# Patient Record
Sex: Female | Born: 1990
Health system: Southern US, Community
[De-identification: ages and names within clinical notes are randomized; demographics above are authoritative.]

## PROBLEM LIST (undated history)

## (undated) DIAGNOSIS — G43909 Migraine, unspecified, not intractable, without status migrainosus: Secondary | ICD-10-CM

## (undated) DIAGNOSIS — G8929 Other chronic pain: Secondary | ICD-10-CM

## (undated) DIAGNOSIS — M545 Low back pain, unspecified: Secondary | ICD-10-CM

---

## 2015-01-05 DIAGNOSIS — O009 Unspecified ectopic pregnancy without intrauterine pregnancy: Secondary | ICD-10-CM

## 2015-01-05 HISTORY — PX: ECTOPIC PREGNANCY SURGERY: SHX613

## 2015-01-05 HISTORY — DX: Unspecified ectopic pregnancy without intrauterine pregnancy: O00.90

## 2020-01-24 ENCOUNTER — Inpatient Hospital Stay (HOSPITAL_BASED_OUTPATIENT_CLINIC_OR_DEPARTMENT_OTHER)
Admission: EM | Admit: 2020-01-24 | Discharge: 2020-01-30 | DRG: 871 | Disposition: A | Discharge status: Home / Self Care | Payer: Medicaid Other | Attending: Internal Medicine | Admitting: Internal Medicine

## 2020-01-24 ENCOUNTER — Other Ambulatory Visit: Payer: Self-pay

## 2020-01-24 ENCOUNTER — Encounter (HOSPITAL_BASED_OUTPATIENT_CLINIC_OR_DEPARTMENT_OTHER): Payer: Self-pay | Admitting: Emergency Medicine

## 2020-01-24 DIAGNOSIS — A4189 Other specified sepsis: Principal | ICD-10-CM | POA: Diagnosis present

## 2020-01-24 DIAGNOSIS — Z881 Allergy status to other antibiotic agents status: Secondary | ICD-10-CM

## 2020-01-24 DIAGNOSIS — I309 Acute pericarditis, unspecified: Secondary | ICD-10-CM | POA: Diagnosis present

## 2020-01-24 DIAGNOSIS — M545 Low back pain, unspecified: Secondary | ICD-10-CM | POA: Diagnosis present

## 2020-01-24 DIAGNOSIS — I214 Non-ST elevation (NSTEMI) myocardial infarction: Secondary | ICD-10-CM | POA: Diagnosis present

## 2020-01-24 DIAGNOSIS — Z8249 Family history of ischemic heart disease and other diseases of the circulatory system: Secondary | ICD-10-CM

## 2020-01-24 DIAGNOSIS — K529 Noninfective gastroenteritis and colitis, unspecified: Secondary | ICD-10-CM | POA: Diagnosis present

## 2020-01-24 DIAGNOSIS — A419 Sepsis, unspecified organism: Secondary | ICD-10-CM | POA: Diagnosis present

## 2020-01-24 DIAGNOSIS — R778 Other specified abnormalities of plasma proteins: Secondary | ICD-10-CM

## 2020-01-24 DIAGNOSIS — D696 Thrombocytopenia, unspecified: Secondary | ICD-10-CM | POA: Diagnosis present

## 2020-01-24 DIAGNOSIS — E876 Hypokalemia: Secondary | ICD-10-CM | POA: Diagnosis present

## 2020-01-24 DIAGNOSIS — G8929 Other chronic pain: Secondary | ICD-10-CM | POA: Diagnosis present

## 2020-01-24 DIAGNOSIS — U071 COVID-19: Secondary | ICD-10-CM | POA: Diagnosis present

## 2020-01-24 DIAGNOSIS — G43909 Migraine, unspecified, not intractable, without status migrainosus: Secondary | ICD-10-CM | POA: Diagnosis present

## 2020-01-24 DIAGNOSIS — R571 Hypovolemic shock: Secondary | ICD-10-CM

## 2020-01-24 DIAGNOSIS — F17200 Nicotine dependence, unspecified, uncomplicated: Secondary | ICD-10-CM | POA: Diagnosis present

## 2020-01-24 DIAGNOSIS — R6521 Severe sepsis with septic shock: Secondary | ICD-10-CM | POA: Diagnosis present

## 2020-01-24 DIAGNOSIS — Z886 Allergy status to analgesic agent status: Secondary | ICD-10-CM

## 2020-01-24 DIAGNOSIS — R7989 Other specified abnormal findings of blood chemistry: Secondary | ICD-10-CM

## 2020-01-24 DIAGNOSIS — A09 Infectious gastroenteritis and colitis, unspecified: Secondary | ICD-10-CM | POA: Diagnosis present

## 2020-01-24 DIAGNOSIS — E860 Dehydration: Secondary | ICD-10-CM | POA: Diagnosis present

## 2020-01-24 DIAGNOSIS — E871 Hypo-osmolality and hyponatremia: Secondary | ICD-10-CM | POA: Diagnosis present

## 2020-01-24 HISTORY — DX: Other chronic pain: G89.29

## 2020-01-24 HISTORY — DX: Low back pain, unspecified: M54.50

## 2020-01-24 HISTORY — DX: Migraine, unspecified, not intractable, without status migrainosus: G43.909

## 2020-01-24 NOTE — ED Notes (Signed)
Pt received 900 ml NS by EMS.

## 2020-01-24 NOTE — ED Triage Notes (Signed)
Pt with Covid symptoms including fever, vomiting, diarrhea, cough, body aches since 1/15

## 2020-01-25 ENCOUNTER — Emergency Department (HOSPITAL_BASED_OUTPATIENT_CLINIC_OR_DEPARTMENT_OTHER): Payer: Medicaid Other

## 2020-01-25 ENCOUNTER — Inpatient Hospital Stay (HOSPITAL_COMMUNITY): Payer: Medicaid Other

## 2020-01-25 ENCOUNTER — Inpatient Hospital Stay (HOSPITAL_COMMUNITY)
Admission: EM | Disposition: A | Discharge status: Home / Self Care | Payer: Self-pay | Source: Home / Self Care | Attending: Internal Medicine

## 2020-01-25 ENCOUNTER — Encounter (HOSPITAL_COMMUNITY): Payer: Self-pay | Admitting: Family Medicine

## 2020-01-25 DIAGNOSIS — A419 Sepsis, unspecified organism: Secondary | ICD-10-CM | POA: Diagnosis not present

## 2020-01-25 DIAGNOSIS — Z886 Allergy status to analgesic agent status: Secondary | ICD-10-CM | POA: Diagnosis not present

## 2020-01-25 DIAGNOSIS — U071 COVID-19: Secondary | ICD-10-CM | POA: Diagnosis present

## 2020-01-25 DIAGNOSIS — I309 Acute pericarditis, unspecified: Secondary | ICD-10-CM | POA: Diagnosis present

## 2020-01-25 DIAGNOSIS — K529 Noninfective gastroenteritis and colitis, unspecified: Secondary | ICD-10-CM

## 2020-01-25 DIAGNOSIS — I361 Nonrheumatic tricuspid (valve) insufficiency: Secondary | ICD-10-CM | POA: Diagnosis not present

## 2020-01-25 DIAGNOSIS — G43909 Migraine, unspecified, not intractable, without status migrainosus: Secondary | ICD-10-CM | POA: Diagnosis present

## 2020-01-25 DIAGNOSIS — A4189 Other specified sepsis: Secondary | ICD-10-CM | POA: Diagnosis present

## 2020-01-25 DIAGNOSIS — I214 Non-ST elevation (NSTEMI) myocardial infarction: Secondary | ICD-10-CM | POA: Diagnosis present

## 2020-01-25 DIAGNOSIS — Z8249 Family history of ischemic heart disease and other diseases of the circulatory system: Secondary | ICD-10-CM | POA: Diagnosis not present

## 2020-01-25 DIAGNOSIS — R6521 Severe sepsis with septic shock: Secondary | ICD-10-CM | POA: Diagnosis present

## 2020-01-25 DIAGNOSIS — M545 Low back pain, unspecified: Secondary | ICD-10-CM | POA: Diagnosis present

## 2020-01-25 DIAGNOSIS — R079 Chest pain, unspecified: Secondary | ICD-10-CM

## 2020-01-25 DIAGNOSIS — R509 Fever, unspecified: Secondary | ICD-10-CM | POA: Diagnosis present

## 2020-01-25 DIAGNOSIS — R778 Other specified abnormalities of plasma proteins: Secondary | ICD-10-CM

## 2020-01-25 DIAGNOSIS — D696 Thrombocytopenia, unspecified: Secondary | ICD-10-CM | POA: Diagnosis present

## 2020-01-25 DIAGNOSIS — I340 Nonrheumatic mitral (valve) insufficiency: Secondary | ICD-10-CM | POA: Diagnosis not present

## 2020-01-25 DIAGNOSIS — E876 Hypokalemia: Secondary | ICD-10-CM

## 2020-01-25 DIAGNOSIS — E860 Dehydration: Secondary | ICD-10-CM | POA: Diagnosis present

## 2020-01-25 DIAGNOSIS — A09 Infectious gastroenteritis and colitis, unspecified: Secondary | ICD-10-CM | POA: Diagnosis present

## 2020-01-25 DIAGNOSIS — F17200 Nicotine dependence, unspecified, uncomplicated: Secondary | ICD-10-CM | POA: Diagnosis present

## 2020-01-25 DIAGNOSIS — I400 Infective myocarditis: Secondary | ICD-10-CM | POA: Diagnosis not present

## 2020-01-25 DIAGNOSIS — B3321 Viral endocarditis: Secondary | ICD-10-CM | POA: Diagnosis not present

## 2020-01-25 DIAGNOSIS — Z881 Allergy status to other antibiotic agents status: Secondary | ICD-10-CM | POA: Diagnosis not present

## 2020-01-25 DIAGNOSIS — G8929 Other chronic pain: Secondary | ICD-10-CM | POA: Diagnosis present

## 2020-01-25 DIAGNOSIS — E871 Hypo-osmolality and hyponatremia: Secondary | ICD-10-CM | POA: Diagnosis present

## 2020-01-25 HISTORY — PX: RIGHT/LEFT HEART CATH AND CORONARY ANGIOGRAPHY: CATH118266

## 2020-01-25 LAB — C-REACTIVE PROTEIN: CRP: 29.4 mg/dL — ABNORMAL HIGH (ref <1.0)

## 2020-01-25 LAB — D-DIMER, QUANTITATIVE: D-Dimer, Quant: 2.45 ug/mL-FEU — ABNORMAL HIGH (ref 0.00–0.50)

## 2020-01-25 LAB — HIV ANTIBODY (ROUTINE TESTING W REFLEX): HIV Screen 4th Generation wRfx: NONREACTIVE

## 2020-01-25 LAB — COMPREHENSIVE METABOLIC PANEL
ALT: 34 U/L (ref 0–44)
AST: 42 U/L — ABNORMAL HIGH (ref 15–41)
Albumin: 3 g/dL — ABNORMAL LOW (ref 3.5–5.0)
Alkaline Phosphatase: 89 U/L (ref 38–126)
Anion gap: 13 (ref 5–15)
BUN: 21 mg/dL — ABNORMAL HIGH (ref 6–20)
CO2: 25 mmol/L (ref 22–32)
Calcium: 7.9 mg/dL — ABNORMAL LOW (ref 8.9–10.3)
Chloride: 94 mmol/L — ABNORMAL LOW (ref 98–111)
Creatinine, Ser: 0.9 mg/dL (ref 0.44–1.00)
GFR, Estimated: >60 mL/min (ref >60)
Glucose, Bld: 96 mg/dL (ref 70–99)
Potassium: 3.4 mmol/L — ABNORMAL LOW (ref 3.5–5.1)
Sodium: 132 mmol/L — ABNORMAL LOW (ref 135–145)
Total Bilirubin: 1.2 mg/dL (ref 0.3–1.2)
Total Protein: 6.9 g/dL (ref 6.5–8.1)

## 2020-01-25 LAB — ECHOCARDIOGRAM LIMITED
Area-P 1/2: 5.42 cm2
Calc EF: 47.5 %
Height: 67 in
S' Lateral: 3.1 cm
Single Plane A2C EF: 48.9 %
Single Plane A4C EF: 47.4 %
Weight: 2304 oz

## 2020-01-25 LAB — CBC WITH DIFFERENTIAL/PLATELET
Abs Immature Granulocytes: 0.14 10*3/uL — ABNORMAL HIGH (ref 0.00–0.07)
Abs Immature Granulocytes: 0.09 10*3/uL — ABNORMAL HIGH (ref 0.00–0.07)
Basophils Absolute: 0.1 10*3/uL (ref 0.0–0.1)
Basophils Absolute: 0 10*3/uL (ref 0.0–0.1)
Basophils Relative: 1 %
Basophils Relative: 0 %
Eosinophils Absolute: 0.1 10*3/uL (ref 0.0–0.5)
Eosinophils Absolute: 0.1 10*3/uL (ref 0.0–0.5)
Eosinophils Relative: 0 %
Eosinophils Relative: 1 %
HCT: 40.5 % (ref 36.0–46.0)
HCT: 35.5 % — ABNORMAL LOW (ref 36.0–46.0)
Hemoglobin: 14 g/dL (ref 12.0–15.0)
Hemoglobin: 11.9 g/dL — ABNORMAL LOW (ref 12.0–15.0)
Immature Granulocytes: 1 %
Immature Granulocytes: 1 %
Lymphocytes Relative: 3 %
Lymphocytes Relative: 4 %
Lymphs Abs: 0.3 10*3/uL — ABNORMAL LOW (ref 0.7–4.0)
Lymphs Abs: 0.4 10*3/uL — ABNORMAL LOW (ref 0.7–4.0)
MCH: 30 pg (ref 26.0–34.0)
MCH: 29.5 pg (ref 26.0–34.0)
MCHC: 34.6 g/dL (ref 30.0–36.0)
MCHC: 33.5 g/dL (ref 30.0–36.0)
MCV: 86.7 fL (ref 80.0–100.0)
MCV: 87.9 fL (ref 80.0–100.0)
Monocytes Absolute: 0.3 10*3/uL (ref 0.1–1.0)
Monocytes Absolute: 0.3 10*3/uL (ref 0.1–1.0)
Monocytes Relative: 2 %
Monocytes Relative: 3 %
Neutro Abs: 12.4 10*3/uL — ABNORMAL HIGH (ref 1.7–7.7)
Neutro Abs: 9.7 10*3/uL — ABNORMAL HIGH (ref 1.7–7.7)
Neutrophils Relative %: 93 %
Neutrophils Relative %: 91 %
Platelets: 92 10*3/uL — ABNORMAL LOW (ref 150–400)
Platelets: 84 10*3/uL — ABNORMAL LOW (ref 150–400)
RBC: 4.67 MIL/uL (ref 3.87–5.11)
RBC: 4.04 MIL/uL (ref 3.87–5.11)
RDW: 14.2 % (ref 11.5–15.5)
RDW: 14.2 % (ref 11.5–15.5)
WBC: 13.2 10*3/uL — ABNORMAL HIGH (ref 4.0–10.5)
WBC: 10.6 10*3/uL — ABNORMAL HIGH (ref 4.0–10.5)
nRBC: 0 % (ref 0.0–0.2)
nRBC: 0 % (ref 0.0–0.2)

## 2020-01-25 LAB — TROPONIN I (HIGH SENSITIVITY)
Troponin I (High Sensitivity): 3072 ng/L (ref <18)
Troponin I (High Sensitivity): 3050 ng/L (ref <18)

## 2020-01-25 LAB — POCT I-STAT EG7
Acid-Base Excess: 2 mmol/L (ref 0.0–2.0)
Bicarbonate: 24.9 mmol/L (ref 20.0–28.0)
Calcium, Ion: 1.06 mmol/L — ABNORMAL LOW (ref 1.15–1.40)
HCT: 32 % — ABNORMAL LOW (ref 36.0–46.0)
Hemoglobin: 10.9 g/dL — ABNORMAL LOW (ref 12.0–15.0)
O2 Saturation: 60 %
Potassium: 3.2 mmol/L — ABNORMAL LOW (ref 3.5–5.1)
Sodium: 135 mmol/L (ref 135–145)
TCO2: 26 mmol/L (ref 22–32)
pCO2, Ven: 33.8 mmHg — ABNORMAL LOW (ref 44.0–60.0)
pH, Ven: 7.476 — ABNORMAL HIGH (ref 7.250–7.430)
pO2, Ven: 29 mmHg — CL (ref 32.0–45.0)

## 2020-01-25 LAB — BASIC METABOLIC PANEL
Anion gap: 11 (ref 5–15)
BUN: 13 mg/dL (ref 6–20)
CO2: 24 mmol/L (ref 22–32)
Calcium: 7.8 mg/dL — ABNORMAL LOW (ref 8.9–10.3)
Chloride: 98 mmol/L (ref 98–111)
Creatinine, Ser: 0.77 mg/dL (ref 0.44–1.00)
GFR, Estimated: >60 mL/min (ref >60)
Glucose, Bld: 100 mg/dL — ABNORMAL HIGH (ref 70–99)
Potassium: 3.2 mmol/L — ABNORMAL LOW (ref 3.5–5.1)
Sodium: 133 mmol/L — ABNORMAL LOW (ref 135–145)

## 2020-01-25 LAB — LACTATE DEHYDROGENASE: LDH: 143 U/L (ref 98–192)

## 2020-01-25 LAB — PROCALCITONIN: Procalcitonin: 4.96 ng/mL

## 2020-01-25 LAB — FERRITIN: Ferritin: 136 ng/mL (ref 11–307)

## 2020-01-25 LAB — PREGNANCY, URINE: Preg Test, Ur: NEGATIVE

## 2020-01-25 LAB — LACTIC ACID, PLASMA
Lactic Acid, Venous: 1.7 mmol/L (ref 0.5–1.9)
Lactic Acid, Venous: 1.8 mmol/L (ref 0.5–1.9)

## 2020-01-25 LAB — SARS CORONAVIRUS 2 BY RT PCR (HOSPITAL ORDER, PERFORMED IN ~~LOC~~ HOSPITAL LAB): SARS Coronavirus 2: POSITIVE — AB

## 2020-01-25 LAB — LIPASE, BLOOD: Lipase: 21 U/L (ref 11–51)

## 2020-01-25 LAB — MAGNESIUM: Magnesium: 2 mg/dL (ref 1.7–2.4)

## 2020-01-25 LAB — TRIGLYCERIDES: Triglycerides: 263 mg/dL — ABNORMAL HIGH (ref <150)

## 2020-01-25 LAB — FIBRINOGEN: Fibrinogen: 558 mg/dL — ABNORMAL HIGH (ref 210–475)

## 2020-01-25 SURGERY — RIGHT/LEFT HEART CATH AND CORONARY ANGIOGRAPHY
Anesthesia: LOCAL

## 2020-01-25 MED ORDER — VERAPAMIL HCL 2.5 MG/ML IV SOLN
INTRAVENOUS | Status: AC
  Filled 2020-01-25: qty 2

## 2020-01-25 MED ORDER — IOHEXOL 350 MG/ML SOLN
INTRAVENOUS | Status: DC | PRN
  Administered 2020-01-25: 45 mL

## 2020-01-25 MED ORDER — ZINC SULFATE 220 (50 ZN) MG PO CAPS
220.0000 mg | ORAL_CAPSULE | Freq: Every day | ORAL | Status: DC
  Administered 2020-01-26 – 2020-01-30 (×5): 220 mg via ORAL
  Filled 2020-01-25 (×6): qty 1

## 2020-01-25 MED ORDER — SODIUM CHLORIDE 0.9 % IV SOLN: INTRAVENOUS | Status: AC

## 2020-01-25 MED ORDER — LIDOCAINE HCL (PF) 1 % IJ SOLN
INTRAMUSCULAR | Status: AC
  Filled 2020-01-25: qty 30

## 2020-01-25 MED ORDER — HEPARIN (PORCINE) 25000 UT/250ML-% IV SOLN: 800.0000 [IU]/h | INTRAVENOUS | Status: DC

## 2020-01-25 MED ORDER — IOHEXOL 350 MG/ML SOLN
INTRAVENOUS | Status: AC
  Filled 2020-01-25: qty 1

## 2020-01-25 MED ORDER — HEPARIN BOLUS VIA INFUSION
4000.0000 [IU] | Freq: Once | INTRAVENOUS | Status: AC
  Administered 2020-01-25: 4000 [IU] via INTRAVENOUS
  Filled 2020-01-25: qty 4000

## 2020-01-25 MED ORDER — GUAIFENESIN-DM 100-10 MG/5ML PO SYRP: 10.0000 mL | ORAL_SOLUTION | ORAL | Status: DC | PRN

## 2020-01-25 MED ORDER — LOPERAMIDE HCL 2 MG PO CAPS: 2.0000 mg | ORAL_CAPSULE | ORAL | Status: DC | PRN

## 2020-01-25 MED ORDER — SODIUM CHLORIDE 0.9 % IV SOLN: 250.0000 mL | INTRAVENOUS | Status: DC | PRN

## 2020-01-25 MED ORDER — METRONIDAZOLE IN NACL 5-0.79 MG/ML-% IV SOLN
500.0000 mg | Freq: Three times a day (TID) | INTRAVENOUS | Status: DC
  Administered 2020-01-25 – 2020-01-29 (×11): 500 mg via INTRAVENOUS
  Filled 2020-01-25 (×12): qty 100

## 2020-01-25 MED ORDER — SODIUM CHLORIDE 0.9 % IV SOLN
100.0000 mg | Freq: Every day | INTRAVENOUS | Status: AC
  Administered 2020-01-26 – 2020-01-29 (×4): 100 mg via INTRAVENOUS
  Filled 2020-01-25 (×4): qty 20

## 2020-01-25 MED ORDER — CALCIUM GLUCONATE-NACL 2-0.675 GM/100ML-% IV SOLN
2.0000 g | Freq: Once | INTRAVENOUS | Status: AC
  Administered 2020-01-25: 2000 mg via INTRAVENOUS
  Filled 2020-01-25: qty 100

## 2020-01-25 MED ORDER — HEPARIN (PORCINE) IN NACL 1000-0.9 UT/500ML-% IV SOLN
INTRAVENOUS | Status: DC | PRN
  Administered 2020-01-25: 500 mL

## 2020-01-25 MED ORDER — ONDANSETRON HCL 4 MG/2ML IJ SOLN
4.0000 mg | Freq: Four times a day (QID) | INTRAMUSCULAR | Status: DC | PRN
  Administered 2020-01-25 – 2020-01-26 (×2): 4 mg via INTRAVENOUS
  Filled 2020-01-25 (×2): qty 2

## 2020-01-25 MED ORDER — ACETAMINOPHEN 325 MG PO TABS
650.0000 mg | ORAL_TABLET | Freq: Four times a day (QID) | ORAL | Status: DC | PRN
  Administered 2020-01-25 – 2020-01-29 (×3): 650 mg via ORAL
  Filled 2020-01-25 (×3): qty 2

## 2020-01-25 MED ORDER — POTASSIUM CHLORIDE CRYS ER 20 MEQ PO TBCR
40.0000 meq | EXTENDED_RELEASE_TABLET | ORAL | Status: AC
  Administered 2020-01-25: 40 meq via ORAL
  Filled 2020-01-25: qty 2

## 2020-01-25 MED ORDER — MIDAZOLAM HCL 2 MG/2ML IJ SOLN
INTRAMUSCULAR | Status: AC
  Filled 2020-01-25: qty 2

## 2020-01-25 MED ORDER — LACTATED RINGERS IV SOLN: INTRAVENOUS | Status: DC

## 2020-01-25 MED ORDER — ACETAMINOPHEN 500 MG PO TABS
1000.0000 mg | ORAL_TABLET | Freq: Once | ORAL | Status: AC
  Administered 2020-01-25: 1000 mg via ORAL
  Filled 2020-01-25: qty 2

## 2020-01-25 MED ORDER — SODIUM CHLORIDE 0.9% FLUSH: 3.0000 mL | INTRAVENOUS | Status: DC | PRN

## 2020-01-25 MED ORDER — FAMOTIDINE 20 MG PO TABS
20.0000 mg | ORAL_TABLET | Freq: Two times a day (BID) | ORAL | Status: DC
  Administered 2020-01-25 – 2020-01-30 (×10): 20 mg via ORAL
  Filled 2020-01-25 (×10): qty 1

## 2020-01-25 MED ORDER — LABETALOL HCL 5 MG/ML IV SOLN: 10.0000 mg | INTRAVENOUS | Status: AC | PRN

## 2020-01-25 MED ORDER — HEPARIN BOLUS VIA INFUSION: 4000.0000 [IU] | Freq: Once | INTRAVENOUS | Status: DC

## 2020-01-25 MED ORDER — HEPARIN (PORCINE) 25000 UT/250ML-% IV SOLN
800.0000 [IU]/h | INTRAVENOUS | Status: DC
  Administered 2020-01-25: 800 [IU]/h via INTRAVENOUS
  Filled 2020-01-25: qty 250

## 2020-01-25 MED ORDER — SODIUM CHLORIDE 0.9% FLUSH
3.0000 mL | Freq: Two times a day (BID) | INTRAVENOUS | Status: DC
  Administered 2020-01-25: 3 mL via INTRAVENOUS

## 2020-01-25 MED ORDER — FENTANYL CITRATE (PF) 100 MCG/2ML IJ SOLN
INTRAMUSCULAR | Status: AC
  Filled 2020-01-25: qty 2

## 2020-01-25 MED ORDER — HEPARIN (PORCINE) IN NACL 1000-0.9 UT/500ML-% IV SOLN
INTRAVENOUS | Status: AC
  Filled 2020-01-25: qty 1000

## 2020-01-25 MED ORDER — CLOPIDOGREL BISULFATE 300 MG PO TABS
ORAL_TABLET | ORAL | Status: AC
  Filled 2020-01-25: qty 1

## 2020-01-25 MED ORDER — LACTATED RINGERS IV BOLUS: 1000.0000 mL | Freq: Once | INTRAVENOUS | Status: DC

## 2020-01-25 MED ORDER — SODIUM CHLORIDE 0.9 % IV BOLUS: 1000.0000 mL | Freq: Once | INTRAVENOUS | Status: DC

## 2020-01-25 MED ORDER — TICAGRELOR 90 MG PO TABS
ORAL_TABLET | ORAL | Status: AC
  Filled 2020-01-25: qty 1

## 2020-01-25 MED ORDER — SODIUM CHLORIDE 0.9% FLUSH
3.0000 mL | Freq: Two times a day (BID) | INTRAVENOUS | Status: DC
  Administered 2020-01-25 – 2020-01-29 (×9): 3 mL via INTRAVENOUS

## 2020-01-25 MED ORDER — LACTATED RINGERS IV BOLUS
1000.0000 mL | Freq: Once | INTRAVENOUS | Status: AC
  Administered 2020-01-25: 1000 mL via INTRAVENOUS

## 2020-01-25 MED ORDER — SODIUM CHLORIDE 0.9 % IV SOLN
2.0000 g | INTRAVENOUS | Status: DC
  Administered 2020-01-25 – 2020-01-29 (×5): 2 g via INTRAVENOUS
  Filled 2020-01-25: qty 2
  Filled 2020-01-25: qty 20
  Filled 2020-01-25 (×3): qty 2
  Filled 2020-01-25: qty 20

## 2020-01-25 MED ORDER — ALBUTEROL SULFATE HFA 108 (90 BASE) MCG/ACT IN AERS
2.0000 | INHALATION_SPRAY | Freq: Four times a day (QID) | RESPIRATORY_TRACT | Status: DC
  Administered 2020-01-26 (×2): 2 via RESPIRATORY_TRACT
  Filled 2020-01-25 (×2): qty 6.7

## 2020-01-25 MED ORDER — IOHEXOL 300 MG/ML  SOLN
100.0000 mL | Freq: Once | INTRAMUSCULAR | Status: AC | PRN
  Administered 2020-01-25: 80 mL via INTRAVENOUS

## 2020-01-25 MED ORDER — HYDROCOD POLST-CPM POLST ER 10-8 MG/5ML PO SUER: 5.0000 mL | Freq: Two times a day (BID) | ORAL | Status: DC | PRN

## 2020-01-25 MED ORDER — COLCHICINE 0.6 MG PO TABS
0.6000 mg | ORAL_TABLET | Freq: Two times a day (BID) | ORAL | Status: DC
  Administered 2020-01-25: 0.6 mg via ORAL
  Filled 2020-01-25: qty 1

## 2020-01-25 MED ORDER — IOHEXOL 350 MG/ML SOLN
100.0000 mL | Freq: Once | INTRAVENOUS | Status: AC | PRN
  Administered 2020-01-25: 100 mL via INTRAVENOUS

## 2020-01-25 MED ORDER — IBUPROFEN 600 MG PO TABS
600.0000 mg | ORAL_TABLET | Freq: Three times a day (TID) | ORAL | Status: DC
  Administered 2020-01-26 – 2020-01-30 (×14): 600 mg via ORAL
  Filled 2020-01-25: qty 3
  Filled 2020-01-25: qty 1
  Filled 2020-01-25: qty 3
  Filled 2020-01-25 (×7): qty 1
  Filled 2020-01-25 (×4): qty 3
  Filled 2020-01-25 (×4): qty 1
  Filled 2020-01-25 (×3): qty 3
  Filled 2020-01-25 (×5): qty 1

## 2020-01-25 MED ORDER — ONDANSETRON HCL 4 MG/2ML IJ SOLN
4.0000 mg | Freq: Once | INTRAMUSCULAR | Status: AC
  Administered 2020-01-25: 4 mg via INTRAVENOUS
  Filled 2020-01-25: qty 2

## 2020-01-25 MED ORDER — CLOPIDOGREL BISULFATE 75 MG PO TABS
ORAL_TABLET | ORAL | Status: AC
  Filled 2020-01-25: qty 1

## 2020-01-25 MED ORDER — NITROGLYCERIN 1 MG/10 ML FOR IR/CATH LAB
INTRA_ARTERIAL | Status: AC
  Filled 2020-01-25: qty 10

## 2020-01-25 MED ORDER — METHYLPREDNISOLONE SODIUM SUCC 40 MG IJ SOLR
0.5000 mg/kg | Freq: Two times a day (BID) | INTRAMUSCULAR | Status: DC
  Administered 2020-01-26 – 2020-01-29 (×8): 32.8 mg via INTRAVENOUS
  Filled 2020-01-25 (×8): qty 1

## 2020-01-25 MED ORDER — SODIUM CHLORIDE 0.9 % IV SOLN
INTRAVENOUS | Status: AC | PRN
  Administered 2020-01-25: 100 mL/h via INTRAVENOUS

## 2020-01-25 MED ORDER — HYDRALAZINE HCL 20 MG/ML IJ SOLN: 10.0000 mg | INTRAMUSCULAR | Status: AC | PRN

## 2020-01-25 MED ORDER — ONDANSETRON HCL 4 MG PO TABS
4.0000 mg | ORAL_TABLET | Freq: Four times a day (QID) | ORAL | Status: DC | PRN
  Filled 2020-01-25: qty 1

## 2020-01-25 MED ORDER — ASCORBIC ACID 500 MG PO TABS
500.0000 mg | ORAL_TABLET | Freq: Every day | ORAL | Status: DC
  Administered 2020-01-26 – 2020-01-30 (×5): 500 mg via ORAL
  Filled 2020-01-25 (×6): qty 1

## 2020-01-25 MED ORDER — POTASSIUM CHLORIDE CRYS ER 20 MEQ PO TBCR
20.0000 meq | EXTENDED_RELEASE_TABLET | ORAL | Status: DC
  Filled 2020-01-25: qty 1

## 2020-01-25 MED ORDER — SODIUM CHLORIDE 0.9% FLUSH: 3.0000 mL | Freq: Two times a day (BID) | INTRAVENOUS | Status: DC

## 2020-01-25 MED ORDER — PREDNISONE 50 MG PO TABS: 50.0000 mg | ORAL_TABLET | Freq: Every day | ORAL | Status: DC

## 2020-01-25 MED ORDER — SODIUM CHLORIDE 0.9 % IV BOLUS
1000.0000 mL | INTRAVENOUS | Status: AC
  Administered 2020-01-25: 1000 mL via INTRAVENOUS

## 2020-01-25 MED ORDER — LIDOCAINE HCL (PF) 1 % IJ SOLN
INTRAMUSCULAR | Status: DC | PRN
  Administered 2020-01-25: 1 mL
  Administered 2020-01-25: 20 mL

## 2020-01-25 MED ORDER — OXYCODONE-ACETAMINOPHEN 5-325 MG PO TABS
1.0000 | ORAL_TABLET | Freq: Four times a day (QID) | ORAL | Status: DC | PRN
  Administered 2020-01-25: 1 via ORAL
  Filled 2020-01-25: qty 1

## 2020-01-25 MED ORDER — SODIUM CHLORIDE 0.9 % IV SOLN
200.0000 mg | Freq: Once | INTRAVENOUS | Status: AC
  Administered 2020-01-25: 200 mg via INTRAVENOUS
  Filled 2020-01-25: qty 40

## 2020-01-25 SURGICAL SUPPLY — 19 items
BAG SNAP BAND KOVER 36X36 (MISCELLANEOUS) ×2 IMPLANT
CATH 5FR JL3.5 JR4 ANG PIG MP (CATHETERS) ×2 IMPLANT
CATH BALLN WEDGE 5F 110CM (CATHETERS) IMPLANT
CATH INFINITI 5FR JL4 (CATHETERS) ×2 IMPLANT
CATH SWAN GANZ 7F STRAIGHT (CATHETERS) ×2 IMPLANT
CLOSURE MYNX CONTROL 5F (Vascular Products) ×2 IMPLANT
COVER DOME SNAP 22 D (MISCELLANEOUS) ×2 IMPLANT
GLIDESHEATH SLEND SS 6F .021 (SHEATH) IMPLANT
GUIDEWIRE INQWIRE 1.5J.035X260 (WIRE) IMPLANT
INQWIRE 1.5J .035X260CM (WIRE)
KIT HEART LEFT (KITS) ×2 IMPLANT
PACK CARDIAC CATHETERIZATION (CUSTOM PROCEDURE TRAY) ×2 IMPLANT
SHEATH GLIDE SLENDER 4/5FR (SHEATH) IMPLANT
SHEATH PINNACLE 5F 10CM (SHEATH) ×2 IMPLANT
SHEATH PINNACLE 7F 10CM (SHEATH) ×2 IMPLANT
SHEATH PROBE COVER 6X72 (BAG) ×2 IMPLANT
TRANSDUCER W/STOPCOCK (MISCELLANEOUS) ×2 IMPLANT
TUBING CIL FLEX 10 FLL-RA (TUBING) ×2 IMPLANT
WIRE EMERALD 3MM-J .035X150CM (WIRE) ×2 IMPLANT

## 2020-01-25 NOTE — Plan of Care (Signed)

## 2020-01-25 NOTE — Progress Notes (Signed)
°  Echocardiogram 2D Echocardiogram has been performed.  Augustine Radar 01/25/2020, 11:47 AM

## 2020-01-25 NOTE — Interval H&P Note (Signed)
History and Physical Interval Note:  01/25/2020 3:05 PM  Katrina [name]  has presented today for surgery, with the diagnosis of chest pain.  The various methods of treatment have been discussed with the patient and family. After consideration of risks, benefits and other options for treatment, the patient has consented to  Procedure(s): RIGHT/LEFT HEART CATH AND CORONARY ANGIOGRAPHY (N/A) as a surgical intervention.  The patient's history has been reviewed, patient examined, no change in status, stable for surgery.  I have reviewed the patient's chart and labs.  Questions were answered to the patient's satisfaction.    Cath Lab Visit (complete for each Cath Lab visit)  Clinical Evaluation Leading to the Procedure:   ACS: Yes.    Non-ACS:    Anginal Classification: CCS II  Anti-ischemic medical therapy: No Therapy  Non-Invasive Test Results: No non-invasive testing performed  Prior CABG: No previous CABG        Verne Carrow

## 2020-01-25 NOTE — Progress Notes (Addendum)
CT angio results:  IMPRESSION: 1. No evidence of pulmonary embolism. 2. Mild atelectasis versus subtle infiltrate at the posterior right lung base. Otherwise no significant airspace disease or evidence of significant COVID pneumonia. 3. Trace pleural fluid in the inferior right hemithorax. 4. Suggestion of possibly mild dilatation of the left ventricle by CT. 5. Suggestion of edema in the body wall fat suggestive of anasarca. 6. Small nonspecific 3 mm nodule in the right middle lobe is likely benign/postinflammatory.  Per d/w Dr. Izora Ribas, proceed with [facility] - he consented patient for this procedure and spoke with Dr. Clifton James who will do the case. Orders written. Already receiving IV fluids so did not adjust those orders. She is noted to have an aspirin allergy which she reports was diffuse rash in 2008 all over her body. Per discussion with MD, hold off on any additional alternative antiplatelet loading at this time pending cath results. Spoke with nurse and patient over the phone and answered patient's questions. Regarding lytes, KCl was ordered a short while ago but not yet able to be given - will request instead of in prep for cath. Also sent update to IM.  Adellyn Capek PA-C

## 2020-01-25 NOTE — Consult Note (Addendum)
Cardiology Consultation:   Patient ID: Katrina [name] MRN: [id number]; DOB: [birth year]  Admit date: 01/24/2020 Date of Consult: 01/25/2020  Primary Care Provider: Patient, No Pcp Per [facility] Cardiologist: New to cardiology [facility] Electrophysiologist:  None   Patient Profile:   Katrina [name] is a 30 y.o. female with a hx of migraines, back pain, possible prior heart murmur who is being seen today for the evaluation of elevated troponin (3k) at the request of Dr. Jacqulyn Bath.  She also presents with Covid symptoms and tested positive. vaccination status: unvaccinated  History of Present Illness:   Katrina [name] has no formal cardiac history aside from being told she had a heart murmur sometime ago perhaps as a teenager. She does not recall this ever being a major issue. She does have significant family history of heart disease - her father died of an MI in his 66s (drugs involved), her brother developed heart failure around age 42, and mother has some sort of tachycardia.   She was in her USOH up until 01/19/19 when she developed fever of 103.5 followed by progressive Covid symptoms to include body aches, coughing, nausea, vomiting, diarrhea especially worse over the last 3 days. She had EMS come out to her house on Tuesday and they administered IVF. She originally felt better for about 12 hours bit symptoms only returned and worsened. She presented to [facility] due to persistent symptoms, including development of persistent chest discomfort and SOB keeping her from being able to sleep. The chest discomfort is substernal and persistent, worse with coughing and breathing, but there to some degree all the time. Upon arrival, she was febrile at 100.5, tachycardic at 116bpm (sinus), and hypotensive with nadir BP 79/51. Labs showed hyponatremia, hypokalemia, mildly elevated BUN of 21 with normal creatinine, calcium of 7.9, albumin of 3.0, AST of 42, leukocytosis 13.2 with elevated  neutrophils, thrombocytopenia with platelet count of 92, negative urine pregnancy test, d-dimer 2.45, CRP 29, troponin 3072->3050. CT of the abdomen/pelvis showed findings concerning for infectious or inflammatory enterocolitis, small free fluid in the abdomen pelvis, small right sided pleural effusions, trace pericardial effusion. CXR NAD. EKG showed sinus tachycardia with inferior ST elevation as well as STE in  V5-V6 but without acute reciprocal changes so not felt to meet STEMI criteria. ED discussed with cardiology fellow on call and per their discussion decision was made to hold off heparin - initial suspicion for myocarditis. She was treated with IV fluids with some improvement in her nausea. She was subsequently transferred to [facility] as soon as a bed was available and continues to be tachycardic and hypotensive despite ongoing fluid resuscitation. She also continues to complain of the same constant chest discomfort she had when she first came into the hospital. Pulse ox seems to be maintaining on RA for the most part but occasional outliers and high RR. Repeat EKG here showed similar STT changes but reviewed with interventionalist who agreed not necessarily STEMI criteria.   Past Medical History:  Diagnosis Date   Chronic back pain    Chronic lower back pain    Ectopic pregnancy 2017   Migraine     Past Surgical History:  Procedure Laterality Date   ECTOPIC PREGNANCY SURGERY  2017     Home Medications:  Prior to Admission medications   Not on File    Inpatient Medications: Scheduled Meds:  albuterol  2 puff Inhalation Q6H   vitamin C  500 mg Oral Daily   famotidine  20 mg Oral BID   heparin  4,000 Units Intravenous Once   methylPREDNISolone (SOLU-MEDROL) injection  0.5 mg/kg Intravenous Q12H   Followed by   Melene Muller ON 01/28/2020] predniSONE  50 mg Oral Daily   potassium chloride  20 mEq Oral STAT   sodium chloride flush  3 mL Intravenous Q12H   zinc  sulfate  220 mg Oral Daily   Continuous Infusions:  calcium gluconate     cefTRIAXone (ROCEPHIN)  IV     heparin     lactated ringers Stopped (01/25/20 0510)   lactated ringers 200 mL/hr at 01/25/20 0735   metronidazole     remdesivir 200 mg in sodium chloride 0.9% 250 mL IVPB     Followed by   Melene Muller ON 01/26/2020] remdesivir 100 mg in NS 100 mL     PRN Meds: acetaminophen, chlorpheniramine-HYDROcodone, guaiFENesin-dextromethorphan, ondansetron **OR** ondansetron (ZOFRAN) IV  Allergies:    Allergies  Allergen Reactions   Aspirin Hives and Rash   Sulfamethoxazole-Trimethoprim Nausea And Vomiting    Social History:   Social History   Socioeconomic History   Marital status: Single    Spouse name: Not on file   Number of children: Not on file   Years of education: Not on file   Highest education level: Not on file  Occupational History   Not on file  Tobacco Use   Smoking status: Light Tobacco Smoker   Smokeless tobacco: Never Used   Tobacco comment: Very rare smoking  Substance and Sexual Activity   Alcohol use: Not Currently   Drug use: Never   Sexual activity: Not on file  Other Topics Concern   Not on file  Social History Narrative   Not on file   Social Determinants of Health   Financial Resource Strain: Not on file  Food Insecurity: Not on file  Transportation Needs: Not on file  Physical Activity: Not on file  Stress: Not on file  Social Connections: Not on file  Intimate Partner Violence: Not on file    Family History:    Family History  Problem Relation Age of Onset   Arrhythmia Mother        Tachycardia, unclear further details   Heart attack Father        Died of MI in his 49s   Heart failure Brother 68   Heart disease Maternal Grandfather      ROS:  Please see the history of present illness.  All other ROS reviewed and negative.     Physical Exam/Data:   Vitals:   01/25/20 0830 01/25/20 1019 01/25/20 1212  01/25/20 1235  BP: (!) 89/65 (!) 85/56 (!) 84/64 (!) 87/67  Pulse: (!) 109 (!) 109 (!) 111 (!) 109  Resp: 20 20 (!) 35 (!) 34  Temp: 99.2 F (37.3 C) 99.1 F (37.3 C) 99.2 F (37.3 C)   TempSrc: Oral Oral    SpO2: 96% 97% (!) 84% 98%  Weight:      Height:        Intake/Output Summary (Last 24 hours) at 01/25/2020 1251 Last data filed at 01/25/2020 0734 Gross per 24 hour  Intake 4001.19 ml  Output --  Net 4001.19 ml   Last 3 Weights 01/24/2020  Weight (lbs) 144 lb  Weight (kg) 65.318 kg     Body mass index is 22.55 kg/m.  Physical Exam From Prior: Gen:  In mild disterss, well developed femeale Neck:  No JVD Cards:  Presence of pulsus alternans, S1, S2, subtle  RV heave, without murmur Lungs:  Coarse breath sounds bilaterally, good air movement Extremities: +2 radial bilaterally Neuro:  Non focal Psych: appropriate mood and affect Addendum:  Physical Exam by MD replacing APPs   EKG:  The EKG was personally reviewed and demonstrates:   1) sinus tach 106bpm, ST elevation II, III, avF, more subtle in V4-V6, QTc 433ms 2) sinus tach 108bpm, still with subtle STE inferior and V5-V6 (less pronounced than prior), coved ST appearance in V2-V3  Telemetry:  Telemetry was personally reviewed and demonstrates:  Sinus tach (low level)  Relevant CV Studies: Limited echo 01/25/20 1. Posterolateral hypokinesis. Hypokinesis of the basal to mid regions  with apical sparing. 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  Left ventricular ejection fraction, by estimation, is 40 to 45%. The left  ventricle has mildly decreased  function. The left ventricle demonstrates regional wall motion  abnormalities (see scoring diagram/findings for description). Left  ventricular diastolic parameters are consistent with Grade II diastolic  dysfunction (pseudonormalization).  2. Hypokinesis of the apical RV. Right ventricular systolic function is  mildly reduced. There is normal pulmonary  artery systolic pressure.  3. A small pericardial effusion is present.  4. Mild mitral valve regurgitation.  5. The aortic valve is tricuspid.  6. The inferior vena cava is normal in size with <50% respiratory  variability, suggesting right atrial pressure of 8 mmHg.   Laboratory Data:  High Sensitivity Troponin:   Recent Labs  Lab 01/25/20 0354 01/25/20 0601  TROPONINIHS 3,072* 3,050*     Chemistry Recent Labs  Lab 01/24/20 2320  NA 132*  K 3.4*  CL 94*  CO2 25  GLUCOSE 96  BUN 21*  CREATININE 0.90  CALCIUM 7.9*  GFRNONAA >60  ANIONGAP 13    Recent Labs  Lab 01/24/20 2320  PROT 6.9  ALBUMIN 3.0*  AST 42*  ALT 34  ALKPHOS 89  BILITOT 1.2   Hematology Recent Labs  Lab 01/24/20 2320  WBC 13.2*  RBC 4.67  HGB 14.0  HCT 40.5  MCV 86.7  MCH 30.0  MCHC 34.6  RDW 14.2  PLT 92*   BNPNo results for input(s): BNP, PROBNP in the last 168 hours.  DDimer  Recent Labs  Lab 01/25/20 0354  DDIMER 2.45*     Radiology/Studies:  CT ABDOMEN PELVIS W CONTRAST  Result Date: 01/25/2020 CLINICAL DATA:  Patient with COVID symptoms including fever vomiting and diarrhea and body aches. EXAM: CT ABDOMEN AND PELVIS WITH CONTRAST TECHNIQUE: Multidetector CT imaging of the abdomen and pelvis was performed using the standard protocol following bolus administration of intravenous contrast. CONTRAST:  80mL OMNIPAQUE IOHEXOL 300 MG/ML  SOLN COMPARISON:  None. FINDINGS: Lower chest: There is an area of atelectasis in the lingula. There is a small right-sided pleural effusion. There is a trace pericardial effusion.The heart size is normal. Hepatobiliary: The liver is normal. Normal gallbladder.There is no biliary ductal dilation. Pancreas: Normal contours without ductal dilatation. No peripancreatic fluid collection. Spleen: Unremarkable. Adrenals/Urinary Tract: --Adrenal glands: Unremarkable. --Right kidney/ureter: No hydronephrosis or radiopaque kidney stones. --Left  kidney/ureter: No hydronephrosis or radiopaque kidney stones. --Urinary bladder: Unremarkable. Stomach/Bowel: --Stomach/Duodenum: No hiatal hernia or other gastric abnormality. Normal duodenal course and caliber. --Small bowel: There are mildly dilated fluid-filled loops of small bowel scattered throughout abdomen, several which demonstrate hyperenhancement and mild wall thickening. --Colon: There appears to be some mild wall thickening of the colon and rectum. There is liquid stool throughout the colon. --Appendix: Normal. Vascular/Lymphatic: Normal course and  caliber of the major abdominal vessels. --No retroperitoneal lymphadenopathy. --No mesenteric lymphadenopathy. --No pelvic or inguinal lymphadenopathy. Reproductive: Unremarkable Other: There is a small volume of free fluid in the abdomen pelvis. The abdominal wall is normal. Musculoskeletal. No acute displaced fractures. IMPRESSION: 1. There are mildly dilated fluid-filled loops of small bowel scattered throughout abdomen, several which demonstrate hyperenhancement and mild wall thickening. There is some mild wall thickening of the colon. Findings are concerning for infectious or inflammatory enterocolitis. 2. Small volume of free fluid in the abdomen pelvis. 3. Small right-sided pleural effusion. 4. Trace pericardial effusion. Electronically Signed   By: Katherine Mantle M.D.   On: 01/25/2020 04:58   DG Chest Portable 1 View  Result Date: 01/25/2020 CLINICAL DATA:  Shortness of breath EXAM: PORTABLE CHEST 1 VIEW COMPARISON:  None. FINDINGS: The heart size and mediastinal contours are within normal limits. Both lungs are clear. The visualized skeletal structures are unremarkable. IMPRESSION: No active disease. Electronically Signed   By: Jonna Clark M.D.   On: 01/25/2020 00:36   ECHOCARDIOGRAM LIMITED  Result Date: 01/25/2020    ECHOCARDIOGRAM LIMITED REPORT   Patient Name:   ZALI KAMAKA Date of Exam: 01/25/2020 Medical Rec #:  [id number]      Height:       67.0 in Accession #:    [id number]    Weight:       144.0 lb Date of Birth:  [birth year]     BSA:          1.759 m Patient Age:    29 years      BP:           85/56 mmHg Patient Gender: F             HR:           102 bpm. Exam Location:  Inpatient Procedure: Cardiac Doppler, Limited Echo and Limited Color Doppler                                MODIFIED REPORT:     This report was modified by Chilton Si MD on 01/25/2020 due to RV                                  hypokinesis.  Indications:     Elevated Troponin  History:         Patient has no prior history of Echocardiogram examinations.  Sonographer:     Eulah Pont RDCS Referring Phys:  Judye Bos DUNN Diagnosing Phys: Chilton Si MD IMPRESSIONS  1. Posterolateral hypokinesis. Hypokinesis of the basal to mid regions with apical sparing. 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 Left ventricular ejection fraction, by estimation, is 40 to 45%. The left ventricle has mildly decreased function. The left ventricle demonstrates regional wall motion abnormalities (see scoring diagram/findings for description). Left ventricular diastolic parameters are consistent with Grade II diastolic dysfunction (pseudonormalization).  2. Hypokinesis of the apical RV. Right ventricular systolic function is mildly reduced. There is normal pulmonary artery systolic pressure.  3. A small pericardial effusion is present.  4. Mild mitral valve regurgitation.  5. The aortic valve is tricuspid.  6. The inferior vena cava is normal in size with <50% respiratory variability, suggesting right atrial pressure of 8 mmHg. FINDINGS  Left Ventricle: Posterolateral hypokinesis. Hypokinesis of the basal to mid regions with  apical sparing. 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 Left ventricular ejection fraction, by estimation, is 40 to 45%. The left ventricle has mildly  decreased function. The left ventricle demonstrates regional wall motion  abnormalities. Left ventricular diastolic parameters are consistent with Grade II diastolic dysfunction (pseudonormalization). Right Ventricle: Hypokinesis of the apical RV. Right ventricular systolic function is mildly reduced. There is normal pulmonary artery systolic pressure. The tricuspid regurgitant velocity is 1.83 m/s, and with an assumed right atrial pressure of 8 mmHg,  the estimated right ventricular systolic pressure is 21.4 mmHg. Pericardium: A small pericardial effusion is present. Mitral Valve: Mild mitral valve regurgitation. Tricuspid Valve: Tricuspid valve regurgitation is mild. Aortic Valve: The aortic valve is tricuspid. Pulmonic Valve: Pulmonic valve regurgitation is trivial. Venous: The inferior vena cava is normal in size with less than 50% respiratory variability, suggesting right atrial pressure of 8 mmHg. LEFT VENTRICLE PLAX 2D LVIDd:         4.20 cm     Diastology LVIDs:         3.10 cm     LV e' medial:    9.37 cm/s LV PW:         0.70 cm     LV E/e' medial:  9.8 LV IVS:        0.60 cm     LV e' lateral:   8.67 cm/s LVOT diam:     1.90 cm     LV E/e' lateral: 10.6 LV SV:         46 LV SV Index:   26 LVOT Area:     2.84 cm  LV Volumes (MOD) LV vol d, MOD A2C: 78.1 ml LV vol d, MOD A4C: 74.4 ml LV vol s, MOD A2C: 39.9 ml LV vol s, MOD A4C: 39.1 ml LV SV MOD A2C:     38.2 ml LV SV MOD A4C:     74.4 ml LV SV MOD BP:      37.1 ml RIGHT VENTRICLE RV S prime:     10.70 cm/s TAPSE (M-mode): 1.8 cm LEFT ATRIUM         Index LA diam:    2.70 cm 1.54 cm/m  AORTIC VALVE LVOT Vmax:   102.00 cm/s LVOT Vmean:  82.600 cm/s LVOT VTI:    0.162 m  AORTA Ao Root diam: 2.40 cm MITRAL VALVE               TRICUSPID VALVE MV Area (PHT): 5.42 cm    TR Peak grad:   13.4 mmHg MV Decel Time: 140 msec    TR Vmax:        183.00 cm/s MV E velocity: 92.10 cm/s MV A velocity: 52.30 cm/s  SHUNTS MV E/A ratio:  1.76        Systemic VTI:  0.16 m                            Systemic Diam: 1.90 cm Chilton Siiffany Summerfield MD  Electronically signed by Chilton Siiffany Firthcliffe MD Signature Date/Time: 01/25/2020/12:14:57 PM    Final (Updated)     Assessment and Plan:   1. Tachycardia and hypotension, consistent with shock picture in the context of Covid-19 infection  2. Elevated troponin, question PE vs myocarditis vs true MI  3. Hyponatremia, hypotension, hypokalemia possibly from GI losses  Spoke with patient over the phone to gather history, and MD physically examined patient as well. We have been in  communication with her nurse and IM about plan to pursue stat CT angio to exclude PE. Some question of RV dysfunction on echo per Dr. Izora Ribas. We'll start heparin per pharmacy. 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 See below for comprehensive thoughts - anticipate evolving plan as more information is known.   Risk Assessment/Risk Scores:     TIMI Risk Score for Unstable Angina or Non-ST Elevation MI:   The patient's TIMI risk score is 3, which indicates a 13% risk of all cause mortality, new or recurrent myocardial infarction or need for urgent revascularization in the next 14 days.   For questions or updates, please contact [facility] Please consult [email or web address] for contact info under    Signed, Laurann Montana, PA-C  01/25/2020 12:51 PM   Personally seen and examined. Agree with APP above with the following comments: Briefly 29 yo F with hypotension and chest pain related to COVID-19 Patient notes that her chest pain has not improved and has no positional component. Exam notable for pulsus alternans, course breath sounds, and a subtle RV heave Labs notable for new thrombocytopenia Personally reviewed relevant tests; LV stroke Voleume of 5 by LVOT, there is decrease in midwall function and some relative enlargement.  Need STAT CTPE.  Given an additional 1 L.  May end up needing  Reasonable to get ibuprofen and colcichine for myocarditis Continue to trend troponin:   May end up needing time sensitive LHC and RHC.   Christell Constant, MD  Addendum: Persistent hypotension with narrow pulse pressures. Proceeding to [facility] and RHC- patient has an ASA allergy of rash and IC team is aware. Risks and benefits of cardiac catheterization have been discussed with the patient.  These include bleeding, infection, kidney damage, stroke, heart attack, death.  The patient understands these risks and is willing to proceed. Paper consent in chart. Primary MD aware. Christell Constant, MD

## 2020-01-25 NOTE — Progress Notes (Signed)
Sent chat msg. To Dr. Carren Rang 2nd requedszt
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
## 2020-01-25 NOTE — Progress Notes (Deleted)
Sent chate message to Dr. Zierle=Ghosh: Good Morning...the patient just arrived to our floor (5W), she is complaining of chest pain and states she feels she can not take a deep breath.  Transport completed a new 12 lead EKG that resulted as ST elevation/STEMI.  Notes from the medical center at [facility] stated she was going to have an echo completed upon arrival here.  I do not see an

## 2020-01-25 NOTE — Progress Notes (Signed)
Sent chat msg. to Dr. Carren Rang:  Good Morning...the patient just arrived to our floor (5W), she is complaining of chest pain and states she feels she can not take a deep breath. Transport completed a new 12 lead EKG that resulted as ST elevation/STEMI. Her BP's remain soft 85/61, HR 110, Repirations: 25-40 on RA sating at 97%. May we get something for pain?

## 2020-01-25 NOTE — H&P (View-Only) (Signed)
Cardiology Consultation:   Patient ID: Katrina [name] MRN: [id number]; DOB: November 15, 1990  Admit date: 01/24/2020 Date of Consult: 01/25/2020  Primary Care Provider: Patient, No Pcp Per [facility] Cardiologist: New to cardiology [facility] Electrophysiologist:  None   Patient Profile:   Katrina [name] is a 30 y.o. female with a hx of migraines, back pain, possible prior heart murmur who is being seen today for the evaluation of elevated troponin (3k) at the request of Dr. Jacqulyn Bath.  She also presents with Covid symptoms and tested positive. vaccination status: unvaccinated  History of Present Illness:   Katrina [name] has no formal cardiac history aside from being told she had a heart murmur sometime ago perhaps as a teenager. She does not recall this ever being a major issue. She does have significant family history of heart disease - her father died of an MI in his 63s (drugs involved), her brother developed heart failure around age 44, and mother has some sort of tachycardia.   She was in her USOH up until 01/19/19 when she developed fever of 103.5 followed by progressive Covid symptoms to include body aches, coughing, nausea, vomiting, diarrhea especially worse over the last 3 days. She had EMS come out to her house on Tuesday and they administered IVF. She originally felt better for about 12 hours bit symptoms only returned and worsened. She presented to [facility] due to persistent symptoms, including development of persistent chest discomfort and SOB keeping her from being able to sleep. The chest discomfort is substernal and persistent, worse with coughing and breathing, but there to some degree all the time. Upon arrival, she was febrile at 100.5, tachycardic at 116bpm (sinus), and hypotensive with nadir BP 79/51. Labs showed hyponatremia, hypokalemia, mildly elevated BUN of 21 with normal creatinine, calcium of 7.9, albumin of 3.0, AST of 42, leukocytosis 13.2 with elevated  neutrophils, thrombocytopenia with platelet count of 92, negative urine pregnancy test, d-dimer 2.45, CRP 29, troponin 3072->3050. CT of the abdomen/pelvis showed findings concerning for infectious or inflammatory enterocolitis, small free fluid in the abdomen pelvis, small right sided pleural effusions, trace pericardial effusion. CXR NAD. EKG showed sinus tachycardia with inferior ST elevation as well as STE in  V5-V6 but without acute reciprocal changes so not felt to meet STEMI criteria. ED discussed with cardiology fellow on call and per their discussion decision was made to hold off heparin - initial suspicion for myocarditis. She was treated with IV fluids with some improvement in her nausea. She was subsequently transferred to [facility] as soon as a bed was available and continues to be tachycardic and hypotensive despite ongoing fluid resuscitation. She also continues to complain of the same constant chest discomfort she had when she first came into the hospital. Pulse ox seems to be maintaining on RA for the most part but occasional outliers and high RR. Repeat EKG here showed similar STT changes but reviewed with interventionalist who agreed not necessarily STEMI criteria.   Past Medical History:  Diagnosis Date   Chronic back pain    Chronic lower back pain    Ectopic pregnancy 2017   Migraine     Past Surgical History:  Procedure Laterality Date   ECTOPIC PREGNANCY SURGERY  2017     Home Medications:  Prior to Admission medications   Not on File    Inpatient Medications: Scheduled Meds:  albuterol  2 puff Inhalation Q6H   vitamin C  500 mg Oral Daily   famotidine  20 mg Oral BID   heparin  4,000 Units Intravenous Once   methylPREDNISolone (SOLU-MEDROL) injection  0.5 mg/kg Intravenous Q12H   Followed by   Melene Muller[START ON 01/28/2020] predniSONE  50 mg Oral Daily   potassium chloride  20 mEq Oral STAT   sodium chloride flush  3 mL Intravenous Q12H   zinc  sulfate  220 mg Oral Daily   Continuous Infusions:  calcium gluconate     cefTRIAXone (ROCEPHIN)  IV     heparin     lactated ringers Stopped (01/25/20 0510)   lactated ringers 200 mL/hr at 01/25/20 0735   metronidazole     remdesivir 200 mg in sodium chloride 0.9% 250 mL IVPB     Followed by   Melene Muller[START ON 01/26/2020] remdesivir 100 mg in NS 100 mL     PRN Meds: acetaminophen, chlorpheniramine-HYDROcodone, guaiFENesin-dextromethorphan, ondansetron **OR** ondansetron (ZOFRAN) IV  Allergies:    Allergies  Allergen Reactions   Aspirin Hives and Rash   Sulfamethoxazole-Trimethoprim Nausea And Vomiting    Social History:   Social History   Socioeconomic History   Marital status: Single    Spouse name: Not on file   Number of children: Not on file   Years of education: Not on file   Highest education level: Not on file  Occupational History   Not on file  Tobacco Use   Smoking status: Light Tobacco Smoker   Smokeless tobacco: Never Used   Tobacco comment: Very rare smoking  Substance and Sexual Activity   Alcohol use: Not Currently   Drug use: Never   Sexual activity: Not on file  Other Topics Concern   Not on file  Social History Narrative   Not on file   Social Determinants of Health   Financial Resource Strain: Not on file  Food Insecurity: Not on file  Transportation Needs: Not on file  Physical Activity: Not on file  Stress: Not on file  Social Connections: Not on file  Intimate Partner Violence: Not on file    Family History:    Family History  Problem Relation Age of Onset   Arrhythmia Mother        Tachycardia, unclear further details   Heart attack Father        Died of MI in his 7640s   Heart failure Brother [id number]   Heart disease Maternal Grandfather      ROS:  Please see the history of present illness.  All other ROS reviewed and negative.     Physical Exam/Data:   Vitals:   01/25/20 0830 01/25/20 1019 01/25/20 1212  01/25/20 1235  BP: (!) 89/65 (!) 85/56 (!) 84/64 (!) 87/67  Pulse: (!) 109 (!) 109 (!) 111 (!) 109  Resp: 20 20 (!) 35 (!) 34  Temp: 99.2 F (37.3 C) 99.1 F (37.3 C) 99.2 F (37.3 C)   TempSrc: Oral Oral    SpO2: 96% 97% (!) 84% 98%  Weight:      Height:        Intake/Output Summary (Last 24 hours) at 01/25/2020 1251 Last data filed at 01/25/2020 0734 Gross per 24 hour  Intake 4001.19 ml  Output --  Net 4001.19 ml   Last 3 Weights 01/24/2020  Weight (lbs) 144 lb  Weight (kg) 65.318 kg     Body mass index is 22.55 kg/m.  Exam will be outlined per MD - initial note prepped remotely   EKG:  The EKG was personally reviewed and demonstrates:   1)  sinus tach 106bpm, ST elevation II, III, avF, more subtle in V4-V6, QTc 433ms 2) sinus tach 108bpm, still with subtle STE inferior and V5-V6 (less pronounced than prior), coved ST appearance in V2-V3  Telemetry:  Telemetry was personally reviewed and demonstrates:  Sinus tach (low level)  Relevant CV Studies: Limited echo 01/25/20 1. Posterolateral hypokinesis. Hypokinesis of the basal to mid regions  with apical sparing. 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  Left ventricular ejection fraction, by estimation, is 40 to 45%. The left  ventricle has mildly decreased  function. The left ventricle demonstrates regional wall motion  abnormalities (see scoring diagram/findings for description). Left  ventricular diastolic parameters are consistent with Grade II diastolic  dysfunction (pseudonormalization).  2. Hypokinesis of the apical RV. Right ventricular systolic function is  mildly reduced. There is normal pulmonary artery systolic pressure.  3. A small pericardial effusion is present.  4. Mild mitral valve regurgitation.  5. The aortic valve is tricuspid.  6. The inferior vena cava is normal in size with <50% respiratory  variability, suggesting right atrial pressure of 8 mmHg.   Laboratory Data:  High  Sensitivity Troponin:   Recent Labs  Lab 01/25/20 0354 01/25/20 0601  TROPONINIHS 3,072* 3,050*     Chemistry Recent Labs  Lab 01/24/20 2320  NA 132*  K 3.4*  CL 94*  CO2 25  GLUCOSE 96  BUN 21*  CREATININE 0.90  CALCIUM 7.9*  GFRNONAA >60  ANIONGAP 13    Recent Labs  Lab 01/24/20 2320  PROT 6.9  ALBUMIN 3.0*  AST 42*  ALT 34  ALKPHOS 89  BILITOT 1.2   Hematology Recent Labs  Lab 01/24/20 2320  WBC 13.2*  RBC 4.67  HGB 14.0  HCT 40.5  MCV 86.7  MCH 30.0  MCHC 34.6  RDW 14.2  PLT 92*   BNPNo results for input(s): BNP, PROBNP in the last 168 hours.  DDimer  Recent Labs  Lab 01/25/20 0354  DDIMER 2.45*     Radiology/Studies:  CT ABDOMEN PELVIS W CONTRAST  Result Date: 01/25/2020 CLINICAL DATA:  Patient with COVID symptoms including fever vomiting and diarrhea and body aches. EXAM: CT ABDOMEN AND PELVIS WITH CONTRAST TECHNIQUE: Multidetector CT imaging of the abdomen and pelvis was performed using the standard protocol following bolus administration of intravenous contrast. CONTRAST:  80mL OMNIPAQUE IOHEXOL 300 MG/ML  SOLN COMPARISON:  None. FINDINGS: Lower chest: There is an area of atelectasis in the lingula. There is a small right-sided pleural effusion. There is a trace pericardial effusion.The heart size is normal. Hepatobiliary: The liver is normal. Normal gallbladder.There is no biliary ductal dilation. Pancreas: Normal contours without ductal dilatation. No peripancreatic fluid collection. Spleen: Unremarkable. Adrenals/Urinary Tract: --Adrenal glands: Unremarkable. --Right kidney/ureter: No hydronephrosis or radiopaque kidney stones. --Left kidney/ureter: No hydronephrosis or radiopaque kidney stones. --Urinary bladder: Unremarkable. Stomach/Bowel: --Stomach/Duodenum: No hiatal hernia or other gastric abnormality. Normal duodenal course and caliber. --Small bowel: There are mildly dilated fluid-filled loops of small bowel scattered throughout abdomen,  several which demonstrate hyperenhancement and mild wall thickening. --Colon: There appears to be some mild wall thickening of the colon and rectum. There is liquid stool throughout the colon. --Appendix: Normal. Vascular/Lymphatic: Normal course and caliber of the major abdominal vessels. --No retroperitoneal lymphadenopathy. --No mesenteric lymphadenopathy. --No pelvic or inguinal lymphadenopathy. Reproductive: Unremarkable Other: There is a small volume of free fluid in the abdomen pelvis. The abdominal wall is normal. Musculoskeletal. No acute displaced fractures. IMPRESSION: 1. There are mildly dilated  fluid-filled loops of small bowel scattered throughout abdomen, several which demonstrate hyperenhancement and mild wall thickening. There is some mild wall thickening of the colon. Findings are concerning for infectious or inflammatory enterocolitis. 2. Small volume of free fluid in the abdomen pelvis. 3. Small right-sided pleural effusion. 4. Trace pericardial effusion. Electronically Signed   By: Katherine Mantle M.D.   On: 01/25/2020 04:58   DG Chest Portable 1 View  Result Date: 01/25/2020 CLINICAL DATA:  Shortness of breath EXAM: PORTABLE CHEST 1 VIEW COMPARISON:  None. FINDINGS: The heart size and mediastinal contours are within normal limits. Both lungs are clear. The visualized skeletal structures are unremarkable. IMPRESSION: No active disease. Electronically Signed   By: Jonna Clark M.D.   On: 01/25/2020 00:36   ECHOCARDIOGRAM LIMITED  Result Date: 01/25/2020    ECHOCARDIOGRAM LIMITED REPORT   Patient Name:   Katrina [name] Date of Exam: 01/25/2020 Medical Rec #:  [id number]     Height:       67.0 in Accession #:    [id number]    Weight:       144.0 lb Date of Birth:  02-09-1990     BSA:          1.759 m Patient Age:    29 years      BP:           85/56 mmHg Patient Gender: F             HR:           102 bpm. Exam Location:  Inpatient Procedure: Cardiac Doppler, Limited Echo and Limited Color  Doppler                                MODIFIED REPORT:     This report was modified by Chilton Si MD on 01/25/2020 due to RV                                  hypokinesis.  Indications:     Elevated Troponin  History:         Patient has no prior history of Echocardiogram examinations.  Sonographer:     Eulah Pont RDCS Referring Phys:  Judye Bos DUNN Diagnosing Phys: Chilton Si MD IMPRESSIONS  1. Posterolateral hypokinesis. Hypokinesis of the basal to mid regions with apical sparing. 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 Left ventricular ejection fraction, by estimation, is 40 to 45%. The left ventricle has mildly decreased function. The left ventricle demonstrates regional wall motion abnormalities (see scoring diagram/findings for description). Left ventricular diastolic parameters are consistent with Grade II diastolic dysfunction (pseudonormalization).  2. Hypokinesis of the apical RV. Right ventricular systolic function is mildly reduced. There is normal pulmonary artery systolic pressure.  3. A small pericardial effusion is present.  4. Mild mitral valve regurgitation.  5. The aortic valve is tricuspid.  6. The inferior vena cava is normal in size with <50% respiratory variability, suggesting right atrial pressure of 8 mmHg. FINDINGS  Left Ventricle: Posterolateral hypokinesis. Hypokinesis of the basal to mid regions with apical sparing. 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 Left ventricular ejection fraction, by estimation, is 40 to 45%. The left ventricle has mildly  decreased function. The left ventricle demonstrates regional wall motion abnormalities. Left ventricular diastolic parameters are consistent with Grade II diastolic dysfunction (pseudonormalization). Right  Ventricle: Hypokinesis of the apical RV. Right ventricular systolic function is mildly reduced. There is normal pulmonary artery systolic pressure. The tricuspid regurgitant velocity is 1.83  m/s, and with an assumed right atrial pressure of 8 mmHg,  the estimated right ventricular systolic pressure is 21.4 mmHg. Pericardium: A small pericardial effusion is present. Mitral Valve: Mild mitral valve regurgitation. Tricuspid Valve: Tricuspid valve regurgitation is mild. Aortic Valve: The aortic valve is tricuspid. Pulmonic Valve: Pulmonic valve regurgitation is trivial. Venous: The inferior vena cava is normal in size with less than 50% respiratory variability, suggesting right atrial pressure of 8 mmHg. LEFT VENTRICLE PLAX 2D LVIDd:         4.20 cm     Diastology LVIDs:         3.10 cm     LV e' medial:    9.37 cm/s LV PW:         0.70 cm     LV E/e' medial:  9.8 LV IVS:        0.60 cm     LV e' lateral:   8.67 cm/s LVOT diam:     1.90 cm     LV E/e' lateral: 10.6 LV SV:         46 LV SV Index:   26 LVOT Area:     2.84 cm  LV Volumes (MOD) LV vol d, MOD A2C: 78.1 ml LV vol d, MOD A4C: 74.4 ml LV vol s, MOD A2C: 39.9 ml LV vol s, MOD A4C: 39.1 ml LV SV MOD A2C:     38.2 ml LV SV MOD A4C:     74.4 ml LV SV MOD BP:      37.1 ml RIGHT VENTRICLE RV S prime:     10.70 cm/s TAPSE (M-mode): 1.8 cm LEFT ATRIUM         Index LA diam:    2.70 cm 1.54 cm/m  AORTIC VALVE LVOT Vmax:   102.00 cm/s LVOT Vmean:  82.600 cm/s LVOT VTI:    0.162 m  AORTA Ao Root diam: 2.40 cm MITRAL VALVE               TRICUSPID VALVE MV Area (PHT): 5.42 cm    TR Peak grad:   13.4 mmHg MV Decel Time: 140 msec    TR Vmax:        183.00 cm/s MV E velocity: 92.10 cm/s MV A velocity: 52.30 cm/s  SHUNTS MV E/A ratio:  1.76        Systemic VTI:  0.16 m                            Systemic Diam: 1.90 cm Chilton Si MD Electronically signed by Chilton Si MD Signature Date/Time: 01/25/2020/12:14:57 PM    Final (Updated)     Assessment and Plan:   1. Tachycardia and hypotension, consistent with shock picture in the context of Covid-19 infection  2. Elevated troponin, question PE vs myocarditis vs true MI  3. Hyponatremia,  hypotension, hypokalemia possibly from GI losses  Spoke with patient over the phone to gather history, and MD physically examined patient as well. We have been in communication with her nurse and IM about plan to pursue stat CT angio to exclude PE. Some question of RV dysfunction on echo per Dr. Izora Ribas. We'll start heparin per pharmacy. 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 See below for comprehensive  thoughts - anticipate evolving plan as more information is known.   Risk Assessment/Risk Scores:     TIMI Risk Score for Unstable Angina or Non-ST Elevation MI:   The patient's TIMI risk score is 3, which indicates a 13% risk of all cause mortality, new or recurrent myocardial infarction or need for urgent revascularization in the next 14 days.   For questions or updates, please contact [facility] Please consult [email or web address] for contact info under    Signed, Laurann Montana, PA-C  01/25/2020 12:51 PM   Personally seen and examined. Agree with APP above with the following comments: Briefly 30 yo F with hypotension and chest pain related to COVID-19 Patient notes that her chest pain has not improved and has no positional component. Exam notable for pulsus alternans, course breath sounds, and a subtle RV heave Labs notable for new thrombocytopenia Personally reviewed relevant tests; LV stroke Voleume of 5 by LVOT, there is decrease in midwall function and some relative enlargement.  Need STAT CTPE.  Given an additional 1 L.  May end up needing  Reasonable to get ibuprofen and colcichine for myocarditis Continue to trend troponin:  May end up needing time sensitive LHC and RHC.   Christell Constant, MD

## 2020-01-25 NOTE — ED Provider Notes (Signed)
Emergency Department Provider Note   I have reviewed the triage vital signs and the nursing notes.   HISTORY  Chief Complaint No chief complaint on file.   HPI Katrina [name] is a 30 y.o. female with past medical history reviewed below presents to the emergency department with 6 days of COVID-like symptoms.  She reports body aches with cough and intermittent fever.  She has had vomiting along with diarrhea.  She reports heaviness in the chest at times especially with talking.  She describes cramping pain throughout her entire abdomen with no focal or unilateral pain.  No dysuria, hesitancy, urgency.  Diarrhea is nonbloody.  She has tried taking Tylenol for her fever but has vomiting shortly afterwards.  Denies any known COVID exposures.  She is not vaccinated for COVID. Patient tells me that EMS came out to her house on Tuesday and gave IVF which improved symptoms for approximately 12 hours but that felt worse afterwards.    Past Medical History:  Diagnosis Date  . Chronic back pain   . Chronic lower back pain   . Migraine     Patient Active Problem List   Diagnosis Date Noted  . NSTEMI (non-ST elevated myocardial infarction) (HCC) 01/25/2020    History reviewed. No pertinent surgical history.  Allergies Aspirin and Bactrim [sulfamethoxazole-trimethoprim]  No family history on file.  Social History Social History   Tobacco Use  . Smoking status: Never Smoker  . Smokeless tobacco: Never Used  Substance Use Topics  . Alcohol use: Not Currently  . Drug use: Never    Review of Systems  Constitutional: Positive fever/chills and body aches.  Eyes: No visual changes. ENT: Mild sore throat. Cardiovascular: Chest heaviness.  Respiratory: Denies shortness of breath. Positive cough.  Gastrointestinal: Cramping abdominal pain. Positive nausea, vomiting, and diarrhea.  No constipation. Genitourinary: Negative for dysuria. Musculoskeletal: Diffuse body aches.  Skin:  Negative for rash. Neurological: Negative for focal weakness or numbness. Positive HA.   10-point ROS otherwise negative.  ____________________________________________   PHYSICAL EXAM:  VITAL SIGNS: ED Triage Vitals  Enc Vitals Group     BP 01/24/20 2320 (!) 94/57     Pulse Rate 01/24/20 2320 (!) 116     Resp 01/24/20 2320 16     Temp 01/24/20 2320 (!) 100.5 F (38.1 C)     Temp Source 01/24/20 2320 Oral     SpO2 01/24/20 2320 96 %     Weight 01/24/20 2319 144 lb (65.3 kg)     Height 01/24/20 2319 5\' 7"  (1.702 m)   Constitutional: Alert and oriented. Well appearing and in no acute distress. Eyes: Conjunctivae are normal. Head: Atraumatic. Nose: No congestion/rhinnorhea. Mouth/Throat: Mucous membranes are dry.    Neck: No stridor.   Cardiovascular: Tachycardia. Good peripheral circulation. Grossly normal heart sounds.   Respiratory: Normal respiratory effort.  No retractions. Lungs CTAB. Gastrointestinal: Soft with mild diffuse tenderness. No rebound or guarding. No distention.  Musculoskeletal: No lower extremity tenderness nor edema. No gross deformities of extremities. Neurologic:  Normal speech and language. No gross focal neurologic deficits are appreciated.  Skin:  Skin is warm, dry and intact. No rash noted.  ____________________________________________   LABS (all labs ordered are listed, but only abnormal results are displayed)  Labs Reviewed  SARS CORONAVIRUS 2 BY RT PCR (HOSPITAL ORDER, PERFORMED IN [facility]) - Abnormal; Notable for the following components:      Result Value   SARS Coronavirus 2 POSITIVE (*)  All other components within normal limits  COMPREHENSIVE METABOLIC PANEL - Abnormal; Notable for the following components:   Sodium 132 (*)    Potassium 3.4 (*)    Chloride 94 (*)    BUN 21 (*)    Calcium 7.9 (*)    Albumin 3.0 (*)    AST 42 (*)    All other components within normal limits  CBC WITH DIFFERENTIAL/PLATELET -  Abnormal; Notable for the following components:   WBC 13.2 (*)    Platelets 92 (*)    Neutro Abs 12.4 (*)    Lymphs Abs 0.3 (*)    Abs Immature Granulocytes 0.14 (*)    All other components within normal limits  D-DIMER, QUANTITATIVE (NOT AT [facility]) - Abnormal; Notable for the following components:   D-Dimer, Quant 2.45 (*)    All other components within normal limits  TROPONIN I (HIGH SENSITIVITY) - Abnormal; Notable for the following components:   Troponin I (High Sensitivity) 3,072 (*)    All other components within normal limits  CULTURE, BLOOD (ROUTINE X 2)  CULTURE, BLOOD (ROUTINE X 2)  LIPASE, BLOOD  PREGNANCY, URINE  LACTIC ACID, PLASMA  LACTIC ACID, PLASMA  PROCALCITONIN  LACTATE DEHYDROGENASE  FERRITIN  TRIGLYCERIDES  FIBRINOGEN  C-REACTIVE PROTEIN  HEPARIN LEVEL (UNFRACTIONATED)  TROPONIN I (HIGH SENSITIVITY)   ____________________________________________  RADIOLOGY  CT ABDOMEN PELVIS W CONTRAST  Result Date: 01/25/2020 CLINICAL DATA:  Patient with COVID symptoms including fever vomiting and diarrhea and body aches. EXAM: CT ABDOMEN AND PELVIS WITH CONTRAST TECHNIQUE: Multidetector CT imaging of the abdomen and pelvis was performed using the standard protocol following bolus administration of intravenous contrast. CONTRAST:  61mL OMNIPAQUE IOHEXOL 300 MG/ML  SOLN COMPARISON:  None. FINDINGS: Lower chest: There is an area of atelectasis in the lingula. There is a small right-sided pleural effusion. There is a trace pericardial effusion.The heart size is normal. Hepatobiliary: The liver is normal. Normal gallbladder.There is no biliary ductal dilation. Pancreas: Normal contours without ductal dilatation. No peripancreatic fluid collection. Spleen: Unremarkable. Adrenals/Urinary Tract: --Adrenal glands: Unremarkable. --Right kidney/ureter: No hydronephrosis or radiopaque kidney stones. --Left kidney/ureter: No hydronephrosis or radiopaque kidney stones. --Urinary bladder:  Unremarkable. Stomach/Bowel: --Stomach/Duodenum: No hiatal hernia or other gastric abnormality. Normal duodenal course and caliber. --Small bowel: There are mildly dilated fluid-filled loops of small bowel scattered throughout abdomen, several which demonstrate hyperenhancement and mild wall thickening. --Colon: There appears to be some mild wall thickening of the colon and rectum. There is liquid stool throughout the colon. --Appendix: Normal. Vascular/Lymphatic: Normal course and caliber of the major abdominal vessels. --No retroperitoneal lymphadenopathy. --No mesenteric lymphadenopathy. --No pelvic or inguinal lymphadenopathy. Reproductive: Unremarkable Other: There is a small volume of free fluid in the abdomen pelvis. The abdominal wall is normal. Musculoskeletal. No acute displaced fractures. IMPRESSION: 1. There are mildly dilated fluid-filled loops of small bowel scattered throughout abdomen, several which demonstrate hyperenhancement and mild wall thickening. There is some mild wall thickening of the colon. Findings are concerning for infectious or inflammatory enterocolitis. 2. Small volume of free fluid in the abdomen pelvis. 3. Small right-sided pleural effusion. 4. Trace pericardial effusion. Electronically Signed   By: Katherine Mantle M.D.   On: 01/25/2020 04:58   DG Chest Portable 1 View  Result Date: 01/25/2020 CLINICAL DATA:  Shortness of breath EXAM: PORTABLE CHEST 1 VIEW COMPARISON:  None. FINDINGS: The heart size and mediastinal contours are within normal limits. Both lungs are clear. The visualized skeletal structures are unremarkable. IMPRESSION: No  active disease. Electronically Signed   By: Jonna Clark M.D.   On: 01/25/2020 00:36    ____________________________________________   PROCEDURES  Procedure(s) performed:   .Critical Care Performed by: Maia Plan, MD Authorized by: Maia Plan, MD   Critical care provider statement:    Critical care time (minutes):   75   Critical care time was exclusive of:  Separately billable procedures and treating other patients and teaching time   Critical care was necessary to treat or prevent imminent or life-threatening deterioration of the following conditions:  Shock   Critical care was time spent personally by me on the following activities:  Discussions with consultants, evaluation of patient's response to treatment, examination of patient, ordering and performing treatments and interventions, ordering and review of laboratory studies, ordering and review of radiographic studies, pulse oximetry, re-evaluation of patient's condition, obtaining history from patient or surrogate, review of old charts, blood draw for specimens and development of treatment plan with patient or surrogate   I assumed direction of critical care for this patient from another provider in my specialty: no     EKG:    EKG Interpretation  Date/Time:  Friday January 25 2020 04:14:21 EST Ventricular Rate:  106 PR Interval:    QRS Duration: 86 QT Interval:  326 QTC Calculation: 433 R Axis:   87 Text Interpretation: Sinus tachycardia Nonspecific ST changes. No STEMI Confirmed by Alona Bene [phone]) on 01/25/2020 4:16:31 AM        ____________________________________________   INITIAL IMPRESSION / ASSESSMENT AND PLAN / ED COURSE  Pertinent labs & imaging results that were available during my care of the patient were reviewed by me and considered in my medical decision making (see chart for details).   Since the emergency department with COVID-19 symptoms.  She is febrile with tachycardia and borderline low blood pressure but normal MAP.  Symptoms seem most consistent with COVID infection.  Given her abnormal vital signs I will send the to our test but suspect this is related to some moderate dehydration.  We will give IV fluids.  Abdomen is not focally tender to suspect acute surgical process at this time.  Will obtain chest x-ray given  some nonspecific heaviness but patient is not having pleuritic pain or hypoxemia.  Doubt ACS, PE, bacterial pneumonia but will follow chest imaging.   02:26 AM  Patient's labs reviewed showing patient is COVID-positive.  She has leukocytosis to 13 and mild hypokalemia.  Kidney function is normal.  Chest x-ray is clear.  Back to reevaluate patient and she is feeling somewhat better after IV fluids.  We will continue IV fluid hydration with low blood pressures here.  Doubt sepsis clinically.  Patient has had significant GI losses over the past 6 days and has been unable to take any significant fluids by mouth.  Exam is not consistent with cardiogenic shock.    05:53 AM With persistently soft blood pressures a CT of the abdomen obtained showing enteritis type state.  The lung fields do not show pulmonary edema.  There is a trace pericardial effusion.  Patient is not having any chest pain or shortness of breath symptoms she is having more diffuse body aches with GI losses.  Added COVID admit labs and troponin came back at 3072.  Discussed the case with the cardiology fellow on-call, Dr. Scarlette Calico.  Myocarditis is a consideration versus demand ischemia in the setting of severe dehydration.  No acute kidney injury.  Plan for echo once the  patient arrives to [facility].  As Shaliyah Taite as patient is well-appearing and has normal mental status with maps near 65 we will hold on pressors.  Patient is awake and alert.  She is on her phone.  She is getting up and going to the bedside commode without lightheadedness or near syncope. Initially ordered heparin given troponin elevation but after Cardiology discussion will cancel for now.   Discussed patient's case with [facility] to request admission. Patient and family (if present) updated with plan. Care transferred to [facility] service.  I reviewed all nursing notes, vitals, pertinent old records, EKGs, labs, imaging (as available).  ____________________________________________  FINAL  CLINICAL IMPRESSION(S) / ED DIAGNOSES  Final diagnoses:  Hypovolemic shock (HCC)  Enterocolitis  COVID-19     MEDICATIONS GIVEN DURING THIS VISIT:  Medications  lactated ringers infusion ( Intravenous New Bag/Given 01/25/20 0232)  lactated ringers bolus 1,000 mL (0 mLs Intravenous Hold 01/25/20 0510)  lactated ringers bolus 1,000 mL (0 mLs Intravenous Stopped 01/25/20 0116)  ondansetron (ZOFRAN) injection 4 mg (4 mg Intravenous Given 01/25/20 0017)  acetaminophen (TYLENOL) tablet 1,000 mg (1,000 mg Oral Given 01/25/20 0015)  lactated ringers bolus 1,000 mL (0 mLs Intravenous Stopped 01/25/20 0221)  lactated ringers bolus 1,000 mL ( Intravenous Stopped 01/25/20 0332)  iohexol (OMNIPAQUE) 300 MG/ML solution 100 mL (80 mLs Intravenous Contrast Given 01/25/20 0441)    Note:  This document was prepared using Dragon voice recognition software and may include unintentional dictation errors.  Alona Bene, MD, [facility] Emergency Medicine    Coraima Tibbs, Arlyss Repress, MD 01/25/20 [phone]

## 2020-01-25 NOTE — Progress Notes (Signed)
ANTICOAGULATION CONSULT NOTE - Initial Consult  Pharmacy Consult for Heparin Indication: chest pain/ACS  Allergies  Allergen Reactions  . Aspirin Hives and Rash  . Sulfamethoxazole-Trimethoprim Nausea And Vomiting    Patient Measurements: Height: 5\' 7"  (170.2 cm) Weight: 65.3 kg (144 lb) IBW/kg (Calculated) : 61.6  Vital Signs: Temp: 99.2 F (37.3 C) (01/21 1212) Temp Source: Oral (01/21 1019) BP: 87/67 (01/21 1235) Pulse Rate: 109 (01/21 1235)  Labs: Recent Labs    01/24/20 2320 01/25/20 0354 01/25/20 0601  HGB 14.0  --   --   HCT 40.5  --   --   PLT 92*  --   --   CREATININE 0.90  --   --   TROPONINIHS  --  3,072* 3,050*    Estimated Creatinine Clearance: 88.1 mL/min (by C-G formula based on SCr of 0.9 mg/dL).   Medical History: Past Medical History:  Diagnosis Date  . Chronic back pain   . Chronic lower back pain   . Ectopic pregnancy 2017  . Migraine     Medications:  Awaiting home med rec  Assessment: 30 y.o. F presents with COVID. Pt with elevated trop and to being heparin for r/o ACS. Plt low on admission at 92.  Heparin was stopped but now resumed per cards.  Goal of Therapy:  Heparin level 0.3-0.7 units/ml Monitor platelets by anticoagulation protocol: Yes   Plan:  Heparin IV bolus 4000 Resume heparin gtt at 800 units/hr Will f/u heparin level in 6 hours Daily heparin level and CBC  38, PharmD, BCIDP, AAHIVP, CPP Infectious Disease Pharmacist 01/25/2020 12:51 PM

## 2020-01-25 NOTE — ED Notes (Signed)
Date and time results received: 01/25/20 0511  Test: Troponin Critical Value: 3072  Name of Provider Notified: Dr. Jacqulyn Bath  Orders Received? Or Actions Taken?: awaiting orders

## 2020-01-25 NOTE — Progress Notes (Signed)
   01/25/20 0830  Assess: MEWS Score  Temp 99.2 F (37.3 C)  BP (!) 89/65  Pulse Rate (!) 109  ECG Heart Rate (!) 108  Resp 20  SpO2 96 %  Assess: MEWS Score  MEWS Temp 0  MEWS Systolic 1  MEWS Pulse 1  MEWS RR 0  MEWS LOC 0  MEWS Score 2  MEWS Score Color Yellow  Assess: if the MEWS score is Yellow or Red  Were vital signs taken at a resting state? Yes  Focused Assessment No change from prior assessment  Early Detection of Sepsis Score *See Row Information* High  MEWS guidelines implemented *See Row Information* Yes  Treat  MEWS Interventions Administered scheduled meds/treatments;Escalated (See documentation below)  Pain Scale 0-10  Pain Score 8  Pain Type Acute pain (chest Pain)  Pain Location Chest  Pain Orientation Medial  Patients Stated Pain Goal 2  Take Vital Signs  Increase Vital Sign Frequency  Yellow: Q 2hr X 2 then Q 4hr X 2, if remains yellow, continue Q 4hrs  Escalate  MEWS: Escalate Yellow: discuss with charge nurse/RN and consider discussing with provider and RRT  Notify: Charge Nurse/RN  Name of Charge Nurse/RN Notified Liana  Date Charge Nurse/RN Notified 01/25/20  Time Charge Nurse/RN Notified 0830

## 2020-01-25 NOTE — Progress Notes (Signed)
ANTICOAGULATION CONSULT NOTE - Initial Consult  Pharmacy Consult for Heparin Indication: chest pain/ACS  Allergies  Allergen Reactions  . Aspirin Hives  . Bactrim [Sulfamethoxazole-Trimethoprim] Nausea And Vomiting    Patient Measurements: Height: 5\' 7"  (170.2 cm) Weight: 65.3 kg (144 lb) IBW/kg (Calculated) : 61.6  Vital Signs: Temp: 99.1 F (37.3 C) (01/21 0230) Temp Source: Oral (01/21 0230) BP: 84/59 (01/21 0500) Pulse Rate: 107 (01/21 0500)  Labs: Recent Labs    01/24/20 2320  HGB 14.0  HCT 40.5  PLT 92*  CREATININE 0.90    Estimated Creatinine Clearance: 88.1 mL/min (by C-G formula based on SCr of 0.9 mg/dL).   Medical History: Past Medical History:  Diagnosis Date  . Chronic back pain   . Chronic lower back pain   . Migraine     Medications:  Awaiting home med rec  Assessment: 30 y.o. F presents with COVID. Pt with elevated trop and to being heparin for r/o ACS. Plt low on admission at 92.  Goal of Therapy:  Heparin level 0.3-0.7 units/ml Monitor platelets by anticoagulation protocol: Yes   Plan:  Heparin IV bolus 4000 Heparin gtt at 800 units/hr Will f/u heparin level in 6 hours Daily heparin level and CBC  38, PharmD, BCPS Please see amion for complete clinical pharmacist phone list 01/25/2020,5:15 AM

## 2020-01-25 NOTE — Progress Notes (Signed)
Per Trish's (Cardiology) request, tubed 12 lead EKG's (current, while in route to [facility], and [facility]) to station [id number].

## 2020-01-25 NOTE — ED Notes (Signed)
Patient transported to CT 

## 2020-01-25 NOTE — H&P (Signed)
History and Physical    Katrina [name] FXT:[id number] DOB: 03-03-1990 DOA: 01/24/2020  Referring MD/NP/PA: Victorino Dike, DO PCP: Patient, No Pcp Per  Patient coming from: Transfer From [facility]  Chief Complaint: Fever and diarrhea  I have personally briefly reviewed patient's old medical records in [facility]   HPI: Katrina [name] is a 30 y.o. female with medical history significant of migraine headaches and chronic low back pain who presents with complaints of 6 days of fever and diarrhea.  She is not immunized against COVID-19.  Complains of associated symptoms of body aches, intermittent nonproductive cough, chest discomfort, shortness of breath, generalized crampy abdominal pain, and generalized weakness.  Denies any blood in emesis or diarrhea.  She has been trying to take Tylenol at home for fever symptoms however was unable to keep any significant amount of food or liquids down.  EMS had come out to her house on 3 days ago and given IV fluids which improved symptoms for the day feeling worse again.  She has not been recently on antibiotics or steroids.  Denies any tobacco, drug use, or daily drinking of alcohol.  Family history is significant for a brother in his 33s who has heart failure, mother suffers from tachycardia, and dad who died of a heart attack in his 78s. She reports previously being told that she had a heart murmur, but had not had any further work-up done in the past.  ED Course: Upon admission into the emergency department patient was seen to have fever up to 100.5 F, pulse 10 3-116, respirations 16-33, blood pressure 79/51-92/65 despite receiving 4 liters of IV fluids, and O2 saturations maintained on room air.  Labs significant for WBC 13.2, platelets 92, sodium 132, potassium 3.4, BUN 21, creatinine 0.9, calcium 7.9, albumin 3, AST 42 Troponin 3072, CRP 29.4, procalcitonin 4.96, D-dimer 2.45, and lactic acid 1.7.  COVID-19 screening was positive.  Chest x-ray was clear.   CT scan of the abdomen pelvis significant for inflammatory or infectious enterocolitis.  Patient has been given at least 4 L of lactated Ringer's and acetaminophen 1000 mg p.o.  Review of Systems  Constitutional: Positive for fever and malaise/fatigue.  HENT: Negative for congestion and nosebleeds.   Eyes: Negative for photophobia and pain.  Respiratory: Positive for cough and shortness of breath.   Cardiovascular: Negative for chest pain and leg swelling.  Gastrointestinal: Positive for abdominal pain, diarrhea, nausea and vomiting. Negative for blood in stool.  Genitourinary: Negative for dysuria and hematuria.  Musculoskeletal: Positive for myalgias. Negative for falls.  Neurological: Positive for weakness. Negative for focal weakness.  Psychiatric/Behavioral: Negative for memory loss and substance abuse.    Past Medical History:  Diagnosis Date   Chronic back pain    Chronic lower back pain    Migraine     History reviewed. No pertinent surgical history.   reports that she has never smoked. She has never used smokeless tobacco. She reports previous alcohol use. She reports that she does not use drugs.  Allergies  Allergen Reactions   Aspirin Hives   Bactrim [Sulfamethoxazole-Trimethoprim] Nausea And Vomiting    Family History  Problem Relation Age of Onset   Arrhythmia Mother        Tachycardia, unclear further details   Heart attack Father        Died of MI in his 68s   Heart failure Brother 38   Heart disease Maternal Grandfather     Prior to Admission medications   Not  on File    Physical Exam:  Constitutional: Young female who appears acutely ill Vitals:   01/25/20 0715 01/25/20 0800 01/25/20 0830 01/25/20 1019  BP: 92/65 (!) 85/61 (!) 89/65 (!) 85/56  Pulse: (!) 112 (!) 109 (!) 109 (!) 109  Resp: (!) 26 (!) 30 20 20   Temp:  98.2 F (36.8 C) 99.2 F (37.3 C) 99.1 F (37.3 C)  TempSrc:  Oral Oral Oral  SpO2: 96% 93% 96% 97%  Weight:       Height:       Eyes: PERRL, lids and conjunctivae normal ENMT: Mucous membranes are dry. Posterior pharynx clear of any exudate or lesions.   Neck: normal, supple, no masses, no thyromegaly Respiratory: Tachypneic with decreased overall aeration.  No wheezes or rhonchi appreciated at this time.  She is able to talk in shortened sentences. Cardiovascular: Tachycardia, no murmurs / rubs / gallops. No extremity edema. 2+ pedal pulses. No carotid bruits.  Abdomen: Mild generalized tenderness, no masses palpated. No hepatosplenomegaly. Bowel sounds positive.  Musculoskeletal: no clubbing / cyanosis. No joint deformity upper and lower extremities. Good ROM, no contractures. Normal muscle tone.  Skin: no rashes, lesions, ulcers. No induration Neurologic: CN 2-12 grossly intact. Sensation intact, DTR normal. Strength 5/5 in all 4.  Psychiatric: Normal judgment and insight. Alert and oriented x 3. Normal mood.     Labs on Admission: I have personally reviewed following labs and imaging studies  CBC: Recent Labs  Lab 01/24/20 2320  WBC 13.2*  NEUTROABS 12.4*  HGB 14.0  HCT 40.5  MCV 86.7  PLT 92*   Basic Metabolic Panel: Recent Labs  Lab 01/24/20 2320  NA 132*  K 3.4*  CL 94*  CO2 25  GLUCOSE 96  BUN 21*  CREATININE 0.90  CALCIUM 7.9*   GFR: Estimated Creatinine Clearance: 88.1 mL/min (by C-G formula based on SCr of 0.9 mg/dL). Liver Function Tests: Recent Labs  Lab 01/24/20 2320  AST 42*  ALT 34  ALKPHOS 89  BILITOT 1.2  PROT 6.9  ALBUMIN 3.0*   Recent Labs  Lab 01/24/20 2320  LIPASE 21   No results for input(s): AMMONIA in the last 168 hours. Coagulation Profile: No results for input(s): INR, PROTIME in the last 168 hours. Cardiac Enzymes: No results for input(s): CKTOTAL, CKMB, CKMBINDEX, TROPONINI in the last 168 hours. BNP (last 3 results) No results for input(s): PROBNP in the last 8760 hours. HbA1C: No results for input(s): HGBA1C in the last 72  hours. CBG: No results for input(s): GLUCAP in the last 168 hours. Lipid Profile: Recent Labs    01/25/20 0354  TRIG 263*   Thyroid Function Tests: No results for input(s): TSH, T4TOTAL, FREET4, T3FREE, THYROIDAB in the last 72 hours. Anemia Panel: Recent Labs    01/25/20 0354  FERRITIN 136   Urine analysis: No results found for: COLORURINE, APPEARANCEUR, LABSPEC, PHURINE, GLUCOSEU, HGBUR, BILIRUBINUR, KETONESUR, PROTEINUR, UROBILINOGEN, NITRITE, LEUKOCYTESUR Sepsis Labs: Recent Results (from the past 240 hour(s))  SARS Coronavirus 2 by RT PCR (hospital order, performed in [facility] hospital lab) Nasopharyngeal Nasopharyngeal Swab     Status: Abnormal   Collection Time: 01/24/20 11:20 PM   Specimen: Nasopharyngeal Swab  Result Value Ref Range Status   SARS Coronavirus 2 POSITIVE (A) NEGATIVE Final    Comment: RESULT CALLED TO, READ BACK BY AND VERIFIED WITH: KELLIE NEAL RN ON 01/25/20 AT 0102 [facility] (NOTE) SARS-CoV-2 target nucleic acids are DETECTED  SARS-CoV-2 RNA is generally detectable in upper  respiratory specimens  during the acute phase of infection.  Positive results are indicative  of the presence of the identified virus, but do not rule out bacterial infection or co-infection with other pathogens not detected by the test.  Clinical correlation with patient history and  other diagnostic information is necessary to determine patient infection status.  The expected result is negative.  Fact Sheet for Patients:   BoilerBrush.com.cyhttps://www.fda.gov/media/136312/download   Fact Sheet for Healthcare Providers:   https://pope.com/https://www.fda.gov/media/136313/download    This test is not yet approved or cleared by the [place] FDA and  has been authorized for detection and/or diagnosis of SARS-CoV-2 by FDA under an Emergency Use Authorization (EUA).  This EUA will remain in effect (meaning this  test can be used) for the duration of  the COVID-19 declaration under Section 564(b)(1) of the  Act, 21 U.S.C. section 360-bbb-3(b)(1), unless the authorization is terminated or revoked sooner.  Performed at [facility], [address] [place], [place] [address]   Blood Culture (routine x 2)     Status: None (Preliminary result)   Collection Time: 01/25/20  3:54 AM   Specimen: BLOOD  Result Value Ref Range Status   Specimen Description BLOOD LEFT ANTECUBITAL  Final   Special Requests   Final    BOTTLES DRAWN AEROBIC AND ANAEROBIC Blood Culture results may not be optimal due to an excessive volume of blood received in culture bottles Performed at [facility], [address] [place], [place] [address]    Culture PENDING  Incomplete   Report Status PENDING  Incomplete  Blood Culture (routine x 2)     Status: None (Preliminary result)   Collection Time: 01/25/20  3:54 AM   Specimen: BLOOD LEFT HAND  Result Value Ref Range Status   Specimen Description BLOOD LEFT HAND  Final   Special Requests   Final    BOTTLES DRAWN AEROBIC AND ANAEROBIC Blood Culture adequate volume Performed at [facility], [address] [place], [place] [address]    Culture PENDING  Incomplete   Report Status PENDING  Incomplete     Radiological Exams on Admission: CT ABDOMEN PELVIS W CONTRAST  Result Date: 01/25/2020 CLINICAL DATA:  Patient with COVID symptoms including fever vomiting and diarrhea and body aches. EXAM: CT ABDOMEN AND PELVIS WITH CONTRAST TECHNIQUE: Multidetector CT imaging of the abdomen and pelvis was performed using the standard protocol following bolus administration of intravenous contrast. CONTRAST:  80mL OMNIPAQUE IOHEXOL 300 MG/ML  SOLN COMPARISON:  None. FINDINGS: Lower chest: There is an area of atelectasis in the lingula. There is a small right-sided pleural effusion. There is a trace pericardial effusion.The heart size is normal. Hepatobiliary: The liver is normal. Normal gallbladder.There is no biliary ductal dilation. Pancreas: Normal contours  without ductal dilatation. No peripancreatic fluid collection. Spleen: Unremarkable. Adrenals/Urinary Tract: --Adrenal glands: Unremarkable. --Right kidney/ureter: No hydronephrosis or radiopaque kidney stones. --Left kidney/ureter: No hydronephrosis or radiopaque kidney stones. --Urinary bladder: Unremarkable. Stomach/Bowel: --Stomach/Duodenum: No hiatal hernia or other gastric abnormality. Normal duodenal course and caliber. --Small bowel: There are mildly dilated fluid-filled loops of small bowel scattered throughout abdomen, several which demonstrate hyperenhancement and mild wall thickening. --Colon: There appears to be some mild wall thickening of the colon and rectum. There is liquid stool throughout the colon. --Appendix: Normal. Vascular/Lymphatic: Normal course and caliber of the major abdominal vessels. --No retroperitoneal lymphadenopathy. --No mesenteric lymphadenopathy. --No pelvic or inguinal lymphadenopathy. Reproductive: Unremarkable Other: There is a small  volume of free fluid in the abdomen pelvis. The abdominal wall is normal. Musculoskeletal. No acute displaced fractures. IMPRESSION: 1. There are mildly dilated fluid-filled loops of small bowel scattered throughout abdomen, several which demonstrate hyperenhancement and mild wall thickening. There is some mild wall thickening of the colon. Findings are concerning for infectious or inflammatory enterocolitis. 2. Small volume of free fluid in the abdomen pelvis. 3. Small right-sided pleural effusion. 4. Trace pericardial effusion. Electronically Signed   By: Katherine Mantle M.D.   On: 01/25/2020 04:58   DG Chest Portable 1 View  Result Date: 01/25/2020 CLINICAL DATA:  Shortness of breath EXAM: PORTABLE CHEST 1 VIEW COMPARISON:  None. FINDINGS: The heart size and mediastinal contours are within normal limits. Both lungs are clear. The visualized skeletal structures are unremarkable. IMPRESSION: No active disease. Electronically Signed   By:  Jonna Clark M.D.   On: 01/25/2020 00:36    EKG: Independently reviewed.  Sinus tachycardia 106 bpm nonspecific ST wave changes  Assessment/Plan Septic shock 2/2 Enterocolitis: Acute.  Patient presented with fever up to 100.5 F with tachycardia and tachypnea. Lactic acid was obtained was reassuring at 1.7.  Imaging significant for concern for enterocolitis.  Initial blood pressures noted to be as low as 79/51 despite patient receiving least 3 to 4 L of IV fluids.  Suspect patient severely dehydrated due to persistent diarrhea with nausea and vomiting most likely secondary to COVID-19 infection.  -Admit to cardiac telemetry bed -Follow-up blood cultures -Check stool studies -Check CT angiogram of the chest to rule out pulmonary embolus as cause of hypotension -Placed on empiric antibiotics of metronidazole and Rocephin in case of underlying infection given elevated procalcitonin and CRP with persistent hypotension -Bolus additional 1 L normal saline IV fluids, then continue at 174ml/hr -Goal MAP 65  Elevated troponin/ NSTEMI: Acute.  Patient does complain of chest discomfort.  High-sensitivity troponins elevated up to 3072-> 3050.  EKG with ST wave changes.  Question possibility of viral myocarditis vs. acs given patient's family heart history. -Check echocardiogram -Heparin drip per pharmacy -Cardiology consultative services, we will follow-up for further recommendation  COVID-19 infection: Acute.  Patient unvaccinated, but noted to be COVID-19 positive.  Chest x-ray otherwise clear and O2 saturations maintained on room air.  Suspect patient's symptoms of nausea, vomiting, diarrhea, fever, cough, and shortness of breath related to COVID-19 infection.  Labs significant for D-dimer 2.45, procalcitonin 4.96, and CRP 29.4 -Airborne precautions -Continuous pulse oximetry with nasal cannula oxygen -Remdesivir per pharmacy -Solu-Medrol IV -Vitamin C and  Hypokalemia  hypocalcemia: Acute.  On  admission potassium noted to be 3.4 with calcium 7.9. -Give 20 mEq potassium chloride x1 dose  -Give calcium gluconate 2 g IV -Continue to monitor and replace as needed  Thrombocytopenia: Acute.  Platelet count 92 on admission.  Patient denied any reports of bleeding. -Continue to monitor  DVT prophylaxis: Hepairn Code Status: Full Family Communication: Significant other number reported not to be in service Disposition Plan: Hopefully discharge home in 2 to 5 days Consults called: Cardiology Admission STATUS: Inpatient, require more than 2 midnight stay due to  Clydie Braun MD [facility]   If 7PM-7AM, please contact night-coverage   01/25/2020, 10:24 AM

## 2020-01-25 NOTE — Progress Notes (Signed)
Katrina [name] called and said he is the patient's fiance and he wanted to speak with the patient.  I was in patient room and she stated that she did NOT want to speak with him and did not want me to release information or speak with him about her.  He has called several times since and is very irate.  The last call he had his Mother on the line, [place]lett [phone].  She is taking care of the patient's three daughters.  She would like the patient to call her.  I will forward the message.

## 2020-01-25 NOTE — Consult Note (Signed)
NAME:  Katrina [name], MRN:  [id number], DOB:  1990/01/20, LOS: 0 ADMISSION DATE:  01/24/2020, CONSULTATION DATE:  01/25/2020 REFERRING MD:  Katrinka Blazing, CHIEF COMPLAINT:  Hypotension in Covid 19  Brief History:  Katrina [name] is a 30 y.o. female with a hx of migraines, back pain, possible prior heart murmur who is being seen 01/25/2020 for the evaluation of elevated troponin (3k), and hypotension in the setting of Covid 19.  ( Tested + 01/24/2020) She presented 01/24/2020 with C?O 6 days of fever and diarrhea.   Vaccination status: unvaccinated  History of Present Illness:  Katrina [name] is a 30 y.o. female with medical history significant of migraine headaches and chronic low back pain who presented 01/24/2020 with complaints of 6 days of fever and diarrhea. Symptoms started 01/19/2019. She had fever as high as 103.5 followed by progressive s/s of Covid.  She is not immunized against COVID-19.  Complains of associated symptoms of body aches, intermittent nonproductive cough, persistent chest discomfort, shortness of breath, generalized crampy abdominal pain, and generalized weakness.  Denies any blood in emesis or diarrhea.  She has been trying to take Tylenol at home for fever symptoms however was unable to keep any significant amount of food or liquids down.  EMS had come out to her house  3 days ago and administered  IV fluids after which symptoms improved for the day, but she then began  feeling worse again.  She has not been  on antibiotics or steroids recently.  Denies any tobacco, drug use, or daily ETOH.  Family history is significant for cardiac disease,  a brother in his 78's  with  heart failure, mother suffers from tachycardia, and dad who died of a heart attack in his 49s. She reports previously being told that she had a heart murmur, but had not had any further work-up done in the past.  ED Course: Upon admission into the emergency department 01/24/2019 patient was seen to have fever up to 100.5 F,  pulse 10 3-116, respirations 16-33, blood pressure 79/51-92/65 despite receiving 4 liters of IV fluids from OSH, and O2 saturations maintained > 90% on room air.  Labs significant for WBC 13.2, platelets 92, sodium 132, potassium 3.4, BUN 21, creatinine 0.9, calcium 7.9, albumin 3, AST 42 Troponin 3072 >> 3050, CRP 29.4, procalcitonin 4.96, D-dimer 2.45, and lactic acid 1.7.  COVID-19 screening was positive.  Chest x-ray was clear.  CT scan of the abdomen pelvis significant for inflammatory or infectious enterocolitis.  Patient has been given at least 4 L of lactated Ringer's and acetaminophen 1000 mg p.o.   EKG showed sinus tachycardia with inferior ST elevation as well as STE in  V5-V6 but without acute reciprocal changes so not felt to meet STEMI criteria. ED discussed with cardiology fellow on call and per their discussion decision was made to hold off heparin - initial suspicion for myocarditis. She was treated with IV fluids with some improvement in her nausea. She was subsequently transferred to [facility] 01/25/2020 as soon as a bed was available and continues to be tachycardic and hypotensive despite ongoing fluid resuscitation. She also continues to complain of the same constant chest discomfort she had when she first came into the hospital. Pulse ox seems to be maintaining on RA for the most part but occasional outliers and high RR. Repeat EKG here showed similar STT changes but reviewed with interventionalist who agreed not necessarily STEMI criteria. Echo concerning for an intracardiac thronbus   PCCM have  been called to address hypotension in setting of Covid 19, in patient with potential to decompensate .   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 Mild atelectasis versus subtle infiltrate at the posterior right lung base. Otherwise no significant airspace disease or evidence of significant COVID pneumonia. Trace pleural fluid in the inferior right hemithorax. Suggestion of possibly mild dilatation of the left ventricle by CT. Suggestion of edema in the body wall fat suggestive of anasarca. Small nonspecific 3 mm nodule in the right middle lobe is likely benign/postinflammatory.  01/25/2020 CXR FINDINGS: The heart size and mediastinal contours are within normal limits. Both lungs are clear. The visualized skeletal structures are unremarkable.  IMPRESSION: No active disease.   Micro Data:  01/25/2020>> Covid 19 +  Antimicrobials:  01/25/2020: Rocephin 01/25/2020: Flagyl 01/25/2020: Remdesivir  Interim History / Subjective:  Tachypnea, rate in 40's with sats of 97% on RA States she has chest pain, ongoing for several days Febrile 99.2 States her breathing does not feel normal  Objective   Blood pressure (!) 87/67, pulse (!) 109, temperature 99.2 F (37.3 C), resp. rate (!) 34, height 5\' 7"  (1.702 m), weight 65.3 kg, SpO2 98 %.        Intake/Output Summary (Last 24 hours) at 01/25/2020 1334 Last data filed at 01/25/2020 0734 Gross per 24 hour  Intake 4001.19 ml  Output --  Net 4001.19 ml   Filed Weights   01/24/20 2319  Weight: 65.3 kg    Examination: General: Awake and alert female , appears ill, RR 42 HENT: Pupils 3 mm and reactive, No JVD, No LAD Lungs:  BIlateral chest excursion, few crackles per bases, diminished per bases, RR 42 Cardiovascular: S1, S2, RRR, Tachy, ? murmur Abdomen: Soft, NT, ND, BS +, Body mass index is 22.55 kg/m. 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 acs Significant Family history CAD + D dimer  >> CTA negative for PE Plan Echo results noted Heparin gtt per pharmacy Rest Per cards >> May need RHC/LHC Will add low dose oxycodone for CP  Will need to monitor for over use with Tylenol for fever Rest per cards    Covid 19 Positive  CT with Mild atelectasis versus subtle infiltrate at the posterior right lung base Plan Airborne precautions Aggressive Pulmonary toilet IS Q 1 while awake as able Continuous sat monitoring Solumedrol per Primary Team Continue Remdesivir per primary team Trend CXR in am and prn ABX as ordered per primary( metronidazole and Rocephin ) Maintain sats > 90% Encourage prone positioning as able  Close monitoring for respiratory decompensation Trend d dimer, Ferritin, CRP  Hypokalemia Plan Replete as needed Trend BMET and Mag ( Keep Mag > 2)  Thrombocytopenia Plts 92K No active bleeding Plan Trend CBC  Monitor for bleeding / bruising    PCCM will continue  to follow as consult  with you. Does not need ICU at present as MAP is 72 , no pressor requirements, and maintaining sats on RA. Will add low dose oxy  for pain  Best practice (evaluated daily)  Diet: NPO Pain/Anxiety/Delirium protocol (if indicated): Per Primary Team VAP protocol (if indicated):NA DVT prophylaxis: Heparin gtt GI prophylaxis:Pepcid  Glucose control: Per Primary team Mobility: Per Primary team Disposition:Progressive Tele Bed  Goals of Care:  Last date of multidisciplinary goals of care discussion:Per Primary Team  Family and staff present: NA Summary of discussion: NA Follow up goals of care discussion due: NA Code Status: Full  Labs   CBC: Recent Labs  Lab 01/24/20 2320  WBC 13.2*  NEUTROABS 12.4*  HGB 14.0  HCT 40.5  MCV 86.7  PLT 92*    Basic Metabolic Panel: Recent Labs  Lab 01/24/20 2320  NA 132*  K 3.4*  CL 94*  CO2 25  GLUCOSE 96  BUN 21*  CREATININE 0.90  CALCIUM 7.9*   GFR: Estimated Creatinine Clearance: 88.1 mL/min (by C-G formula based on SCr of 0.9 mg/dL). Recent Labs  Lab 01/24/20 2320 01/25/20 0354 01/25/20 1002  PROCALCITON  --  4.96  --   WBC 13.2*  --   --   LATICACIDVEN  --  1.7 1.8    Liver Function Tests: Recent Labs  Lab 01/24/20 2320  AST 42*  ALT 34  ALKPHOS 89  BILITOT 1.2  PROT 6.9  ALBUMIN 3.0*   Recent Labs  Lab 01/24/20 2320  LIPASE 21   No results for input(s): AMMONIA in the last 168 hours.  ABG No results found for: PHART, PCO2ART, PO2ART, HCO3, TCO2, ACIDBASEDEF, O2SAT   Coagulation Profile: No results for input(s): INR, PROTIME in the last 168 hours.  Cardiac Enzymes: No results for input(s): CKTOTAL, CKMB, CKMBINDEX, TROPONINI in the last 168 hours.  HbA1C: No results found for: HGBA1C  CBG: No results for input(s): GLUCAP in the last 168 hours.  Review of Systems:   + for fever, body aches, chest pain, tachypnea  Past Medical History:  She,  has a past medical history  of Chronic back pain, Chronic lower back pain, Ectopic pregnancy (2017), and Migraine.   Surgical History:   Past Surgical History:  Procedure Laterality Date   ECTOPIC PREGNANCY SURGERY  2017     Social History:   reports that she has been smoking. She has never used smokeless tobacco. She reports previous alcohol use. She reports that she does not use drugs.   Family History:  Her family history includes Arrhythmia in her mother; Heart attack in her father; Heart disease in her maternal grandfather; Heart failure (age of onset: 45) in her brother.   Allergies Allergies  Allergen Reactions   Aspirin Hives and Rash   Sulfamethoxazole-Trimethoprim Nausea And Vomiting  Home Medications  Prior to Admission medications   Not on File     APP time 60 minutes    Bevelyn Ngo, MSN, AGACNP-BC [facility] Pulmonary/Critical Care Medicine See Amion for personal pager PCCM on call pager [phone] 01/25/2020 2:55 PM

## 2020-01-26 DIAGNOSIS — U071 COVID-19: Secondary | ICD-10-CM | POA: Diagnosis not present

## 2020-01-26 DIAGNOSIS — I400 Infective myocarditis: Secondary | ICD-10-CM

## 2020-01-26 LAB — COMPREHENSIVE METABOLIC PANEL
ALT: 36 U/L (ref 0–44)
AST: 95 U/L — ABNORMAL HIGH (ref 15–41)
Albumin: 2.1 g/dL — ABNORMAL LOW (ref 3.5–5.0)
Alkaline Phosphatase: 92 U/L (ref 38–126)
Anion gap: 10 (ref 5–15)
BUN: 11 mg/dL (ref 6–20)
CO2: 22 mmol/L (ref 22–32)
Calcium: 7.4 mg/dL — ABNORMAL LOW (ref 8.9–10.3)
Chloride: 99 mmol/L (ref 98–111)
Creatinine, Ser: 0.78 mg/dL (ref 0.44–1.00)
GFR, Estimated: >60 mL/min (ref >60)
Glucose, Bld: 93 mg/dL (ref 70–99)
Potassium: 3.7 mmol/L (ref 3.5–5.1)
Sodium: 131 mmol/L — ABNORMAL LOW (ref 135–145)
Total Bilirubin: 1.1 mg/dL (ref 0.3–1.2)
Total Protein: 5.1 g/dL — ABNORMAL LOW (ref 6.5–8.1)

## 2020-01-26 LAB — MRSA PCR SCREENING: MRSA by PCR: POSITIVE — AB

## 2020-01-26 LAB — CBC WITH DIFFERENTIAL/PLATELET
Abs Immature Granulocytes: 0.07 10*3/uL (ref 0.00–0.07)
Basophils Absolute: 0 10*3/uL (ref 0.0–0.1)
Basophils Relative: 0 %
Eosinophils Absolute: 0.1 10*3/uL (ref 0.0–0.5)
Eosinophils Relative: 1 %
HCT: 32.6 % — ABNORMAL LOW (ref 36.0–46.0)
Hemoglobin: 11.1 g/dL — ABNORMAL LOW (ref 12.0–15.0)
Immature Granulocytes: 1 %
Lymphocytes Relative: 6 %
Lymphs Abs: 0.5 10*3/uL — ABNORMAL LOW (ref 0.7–4.0)
MCH: 29.3 pg (ref 26.0–34.0)
MCHC: 34 g/dL (ref 30.0–36.0)
MCV: 86 fL (ref 80.0–100.0)
Monocytes Absolute: 0.3 10*3/uL (ref 0.1–1.0)
Monocytes Relative: 3 %
Neutro Abs: 8 10*3/uL — ABNORMAL HIGH (ref 1.7–7.7)
Neutrophils Relative %: 89 %
Platelets: 91 10*3/uL — ABNORMAL LOW (ref 150–400)
RBC: 3.79 MIL/uL — ABNORMAL LOW (ref 3.87–5.11)
RDW: 14.3 % (ref 11.5–15.5)
WBC: 8.9 10*3/uL (ref 4.0–10.5)
nRBC: 0 % (ref 0.0–0.2)

## 2020-01-26 LAB — FERRITIN: Ferritin: 272 ng/mL (ref 11–307)

## 2020-01-26 LAB — C-REACTIVE PROTEIN: CRP: 22.2 mg/dL — ABNORMAL HIGH (ref <1.0)

## 2020-01-26 LAB — MAGNESIUM: Magnesium: 2.1 mg/dL (ref 1.7–2.4)

## 2020-01-26 LAB — D-DIMER, QUANTITATIVE: D-Dimer, Quant: 3.36 ug/mL-FEU — ABNORMAL HIGH (ref 0.00–0.50)

## 2020-01-26 LAB — PHOSPHORUS: Phosphorus: 2 mg/dL — ABNORMAL LOW (ref 2.5–4.6)

## 2020-01-26 MED ORDER — COLCHICINE 0.6 MG PO TABS
0.6000 mg | ORAL_TABLET | Freq: Every day | ORAL | Status: DC
  Administered 2020-01-26 – 2020-01-30 (×5): 0.6 mg via ORAL
  Filled 2020-01-26 (×5): qty 1

## 2020-01-26 MED ORDER — WHITE PETROLATUM EX OINT
TOPICAL_OINTMENT | CUTANEOUS | Status: AC
  Administered 2020-01-26: 0.2
  Filled 2020-01-26: qty 28.35

## 2020-01-26 MED ORDER — MUPIROCIN 2 % EX OINT
TOPICAL_OINTMENT | Freq: Two times a day (BID) | CUTANEOUS | Status: DC
  Administered 2020-01-26 – 2020-01-29 (×5): 1 via NASAL
  Filled 2020-01-26 (×2): qty 22

## 2020-01-26 NOTE — Progress Notes (Signed)
Progress Note  Patient Name: Katrina HeraldKerry [name] Date of Encounter: 01/26/2020  Primary Cardiologist: Izora Ribashandrasekhar- New  Subjective   30 yo F with persistent CP in the setting of COVID-19 with mild LV dysfunction with likely myopericarditis.  Widely Patent CORS 01/25/20.  CTPE negative.    No chest pain or pressure.  No SOB/DOE and no PND/Orthopnea.  No weight gain or leg swelling.  No palpitations or syncope.  Still feels weak but better  Inpatient Medications    Scheduled Meds:  albuterol  2 puff Inhalation Q6H   vitamin C  500 mg Oral Daily   colchicine  0.6 mg Oral BID   famotidine  20 mg Oral BID   ibuprofen  600 mg Oral TID WC   methylPREDNISolone (SOLU-MEDROL) injection  0.5 mg/kg Intravenous Q12H   sodium chloride flush  3 mL Intravenous Q12H   zinc sulfate  220 mg Oral Daily   Continuous Infusions:  sodium chloride     cefTRIAXone (ROCEPHIN)  IV 2 g (01/25/20 2004)   lactated ringers Stopped (01/25/20 0510)   lactated ringers 200 mL/hr at 01/25/20 0735   metronidazole 500 mg (01/26/20 0256)   remdesivir 100 mg in NS 100 mL     sodium chloride     PRN Meds: sodium chloride, acetaminophen, chlorpheniramine-HYDROcodone, guaiFENesin-dextromethorphan, loperamide, ondansetron **OR** ondansetron (ZOFRAN) IV, oxyCODONE-acetaminophen, sodium chloride flush   Vital Signs    Vitals:   01/25/20 2255 01/25/20 2345 01/26/20 0250 01/26/20 0300  BP:  (!) 78/55 (!) 83/57 (!) 89/61  Pulse: (!) 118 (!) 117 (!) 115 (!) 114  Resp: (!) 26 (!) 24 (!) 36 (!) 32  Temp:  (!) 100.5 F (38.1 C) (!) 102.8 F (39.3 C) 99.9 F (37.7 C)  TempSrc:  Oral Oral Oral  SpO2: 92% 95% 92% 94%  Weight:      Height:        Intake/Output Summary (Last 24 hours) at 01/26/2020 0758 Last data filed at 01/26/2020 0600 Gross per 24 hour  Intake 2011.5 ml  Output --  Net 2011.5 ml   Filed Weights   01/24/20 2319  Weight: 65.3 kg    Telemetry    Sinus Tachy-> sinus rhythm presently - Personally  Reviewed  ECG    No new- Personally Reviewed  Physical Exam   GEN: No acute distress.   Neck: No JVD Cardiac: RRR, no murmurs, rubs, or gallops. Radial site c/d/I  Respiratory: Coarse breath sounds bilaterally GI: Soft, nontender, non-distended  MS: No edema; No deformity. Neuro:  Nonfocal  Psych: Normal affect   Labs    Chemistry Recent Labs  Lab 01/24/20 2320 01/25/20 1232 01/25/20 1548 01/26/20 0119  NA 132* 133* 135 131*  K 3.4* 3.2* 3.2* 3.7  CL 94* 98  --  99  CO2 25 24  --  22  GLUCOSE 96 100*  --  93  BUN 21* 13  --  11  CREATININE 0.90 0.77  --  0.78  CALCIUM 7.9* 7.8*  --  7.4*  PROT 6.9  --   --  5.1*  ALBUMIN 3.0*  --   --  2.1*  AST 42*  --   --  95*  ALT 34  --   --  36  ALKPHOS 89  --   --  92  BILITOT 1.2  --   --  1.1  GFRNONAA >60 >60  --  >60  ANIONGAP 13 11  --  10     Hematology Recent  Labs  Lab 01/24/20 2320 01/25/20 1232 01/25/20 1548 01/26/20 0119  WBC 13.2* 10.6*  --  8.9  RBC 4.67 4.04  --  3.79*  HGB 14.0 11.9* 10.9* 11.1*  HCT 40.5 35.5* 32.0* 32.6*  MCV 86.7 87.9  --  86.0  MCH 30.0 29.5  --  29.3  MCHC 34.6 33.5  --  34.0  RDW 14.2 14.2  --  14.3  PLT 92* 84*  --  91*    Cardiac EnzymesNo results for input(s): TROPONINI in the last 168 hours. No results for input(s): TROPIPOC in the last 168 hours.   BNPNo results for input(s): BNP, PROBNP in the last 168 hours.   DDimer  Recent Labs  Lab 01/25/20 0354 01/26/20 0119  DDIMER 2.45* 3.36*     Radiology    CT ANGIO CHEST PE W OR WO CONTRAST  Result Date: 01/25/2020 CLINICAL DATA:  COVID-19 infection, shortness of breath, cough, fever and vomiting. Elevated D-dimer and elevated troponin I levels. EXAM: CT ANGIOGRAPHY CHEST WITH CONTRAST TECHNIQUE: Multidetector CT imaging of the chest was performed using the standard protocol during bolus administration of intravenous contrast. Multiplanar CT image reconstructions and MIPs were obtained to evaluate the vascular  anatomy. CONTRAST:  OMNIPAQUE IOHEXOL 350 MG/ML SOLN COMPARISON:  Chest x-ray earlier today. FINDINGS: Cardiovascular: Pulmonary arteries are well opacified and of normal caliber. No evidence of pulmonary embolism. The thoracic aorta is normal in caliber. The heart size is top normal. Suggestion possibly mild dilatation of the left ventricle by CT. No pericardial fluid identified. No visible calcified coronary artery plaque. Mediastinum/Nodes: No enlarged mediastinal, hilar, or axillary lymph nodes. Thyroid gland, trachea, and esophagus demonstrate no significant findings. Lungs/Pleura: Trace pleural fluid in the inferior right hemithorax. Atelectasis and scarring along the inferior aspect of the left major fissure extending into the inferior lingula. Mild atelectasis versus subtle infiltrate at the posterior right lung base. Otherwise no significant airspace disease or widespread evidence of COVID pneumonia. Tiny nonspecific 3 mm nodule in the right middle lobe is likely benign. No overt pulmonary edema or pneumothorax. Upper Abdomen: No acute abnormality. Musculoskeletal: No bony abnormalities. The body wall soft tissues of the chest and visualized upper abdomen demonstrate suggestion edema in the body wall fat suggestive of anasarca. Review of the MIP images confirms the above findings. IMPRESSION: 1. No evidence of pulmonary embolism. 2. Mild atelectasis versus subtle infiltrate at the posterior right lung base. Otherwise no significant airspace disease or evidence of significant COVID pneumonia. 3. Trace pleural fluid in the inferior right hemithorax. 4. Suggestion of possibly mild dilatation of the left ventricle by CT. 5. Suggestion of edema in the body wall fat suggestive of anasarca. 6. Small nonspecific 3 mm nodule in the right middle lobe is likely benign/postinflammatory. Electronically Signed   By: Irish Lack M.D.   On: 01/25/2020 13:28   CT ABDOMEN PELVIS W CONTRAST  Result Date:  01/25/2020 CLINICAL DATA:  Patient with COVID symptoms including fever vomiting and diarrhea and body aches. EXAM: CT ABDOMEN AND PELVIS WITH CONTRAST TECHNIQUE: Multidetector CT imaging of the abdomen and pelvis was performed using the standard protocol following bolus administration of intravenous contrast. CONTRAST:  14mL OMNIPAQUE IOHEXOL 300 MG/ML  SOLN COMPARISON:  None. FINDINGS: Lower chest: There is an area of atelectasis in the lingula. There is a small right-sided pleural effusion. There is a trace pericardial effusion.The heart size is normal. Hepatobiliary: The liver is normal. Normal gallbladder.There is no biliary ductal dilation. Pancreas: Normal  contours without ductal dilatation. No peripancreatic fluid collection. Spleen: Unremarkable. Adrenals/Urinary Tract: --Adrenal glands: Unremarkable. --Right kidney/ureter: No hydronephrosis or radiopaque kidney stones. --Left kidney/ureter: No hydronephrosis or radiopaque kidney stones. --Urinary bladder: Unremarkable. Stomach/Bowel: --Stomach/Duodenum: No hiatal hernia or other gastric abnormality. Normal duodenal course and caliber. --Small bowel: There are mildly dilated fluid-filled loops of small bowel scattered throughout abdomen, several which demonstrate hyperenhancement and mild wall thickening. --Colon: There appears to be some mild wall thickening of the colon and rectum. There is liquid stool throughout the colon. --Appendix: Normal. Vascular/Lymphatic: Normal course and caliber of the major abdominal vessels. --No retroperitoneal lymphadenopathy. --No mesenteric lymphadenopathy. --No pelvic or inguinal lymphadenopathy. Reproductive: Unremarkable Other: There is a small volume of free fluid in the abdomen pelvis. The abdominal wall is normal. Musculoskeletal. No acute displaced fractures. IMPRESSION: 1. There are mildly dilated fluid-filled loops of small bowel scattered throughout abdomen, several which demonstrate hyperenhancement and mild  wall thickening. There is some mild wall thickening of the colon. Findings are concerning for infectious or inflammatory enterocolitis. 2. Small volume of free fluid in the abdomen pelvis. 3. Small right-sided pleural effusion. 4. Trace pericardial effusion. Electronically Signed   By: Katherine Mantle M.D.   On: 01/25/2020 04:58   CARDIAC CATHETERIZATION  Result Date: 01/25/2020 1. No angiographic evidence of CAD 2. Normal filling pressures 3. Elevated troponin likely due demand ischemia from viral syndrome/hypotension/dehydration. Recommendations: No further ischemic evaluation.   DG Chest Portable 1 View  Result Date: 01/25/2020 CLINICAL DATA:  Shortness of breath EXAM: PORTABLE CHEST 1 VIEW COMPARISON:  None. FINDINGS: The heart size and mediastinal contours are within normal limits. Both lungs are clear. The visualized skeletal structures are unremarkable. IMPRESSION: No active disease. Electronically Signed   By: Jonna Clark M.D.   On: 01/25/2020 00:36   ECHOCARDIOGRAM LIMITED  Result Date: 01/25/2020    ECHOCARDIOGRAM LIMITED REPORT   Patient Name:   TELESIA ATES Date of Exam: 01/25/2020 Medical Rec #:  [id number]     Height:       67.0 in Accession #:    [id number]    Weight:       144.0 lb Date of Birth:  03-09-1990     BSA:          1.759 m Patient Age:    29 years      BP:           85/56 mmHg Patient Gender: F             HR:           102 bpm. Exam Location:  Inpatient Procedure: Cardiac Doppler, Limited Echo and Limited Color Doppler                                MODIFIED REPORT:     This report was modified by Chilton Si MD on 01/25/2020 due to RV                                  hypokinesis.  Indications:     Elevated Troponin  History:         Patient has no prior history of Echocardiogram examinations.  Sonographer:     Eulah Pont RDCS Referring Phys:  Judye Bos DUNN Diagnosing Phys: Chilton Si MD IMPRESSIONS  1. Posterolateral hypokinesis. Hypokinesis of the basal  to  mid regions with apical sparing. 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 Left ventricular ejection fraction, by estimation, is 40 to 45%. The left ventricle has mildly decreased function. The left ventricle demonstrates regional wall motion abnormalities (see scoring diagram/findings for description). Left ventricular diastolic parameters are consistent with Grade II diastolic dysfunction (pseudonormalization).  2. Hypokinesis of the apical RV. Right ventricular systolic function is mildly reduced. There is normal pulmonary artery systolic pressure.  3. A small pericardial effusion is present.  4. Mild mitral valve regurgitation.  5. The aortic valve is tricuspid.  6. The inferior vena cava is normal in size with <50% respiratory variability, suggesting right atrial pressure of 8 mmHg. FINDINGS  Left Ventricle: Posterolateral hypokinesis. Hypokinesis of the basal to mid regions with apical sparing. 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 Left ventricular ejection fraction, by estimation, is 40 to 45%. The left ventricle has mildly  decreased function. The left ventricle demonstrates regional wall motion abnormalities. Left ventricular diastolic parameters are consistent with Grade II diastolic dysfunction (pseudonormalization). Right Ventricle: Hypokinesis of the apical RV. Right ventricular systolic function is mildly reduced. There is normal pulmonary artery systolic pressure. The tricuspid regurgitant velocity is 1.83 m/s, and with an assumed right atrial pressure of 8 mmHg,  the estimated right ventricular systolic pressure is 21.4 mmHg. Pericardium: A small pericardial effusion is present. Mitral Valve: Mild mitral valve regurgitation. Tricuspid Valve: Tricuspid valve regurgitation is mild. Aortic Valve: The aortic valve is tricuspid. Pulmonic Valve: Pulmonic valve regurgitation is trivial. Venous: The inferior vena cava is normal in size with less than 50% respiratory  variability, suggesting right atrial pressure of 8 mmHg. LEFT VENTRICLE PLAX 2D LVIDd:         4.20 cm     Diastology LVIDs:         3.10 cm     LV e' medial:    9.37 cm/s LV PW:         0.70 cm     LV E/e' medial:  9.8 LV IVS:        0.60 cm     LV e' lateral:   8.67 cm/s LVOT diam:     1.90 cm     LV E/e' lateral: 10.6 LV SV:         46 LV SV Index:   26 LVOT Area:     2.84 cm  LV Volumes (MOD) LV vol d, MOD A2C: 78.1 ml LV vol d, MOD A4C: 74.4 ml LV vol s, MOD A2C: 39.9 ml LV vol s, MOD A4C: 39.1 ml LV SV MOD A2C:     38.2 ml LV SV MOD A4C:     74.4 ml LV SV MOD BP:      37.1 ml RIGHT VENTRICLE RV S prime:     10.70 cm/s TAPSE (M-mode): 1.8 cm LEFT ATRIUM         Index LA diam:    2.70 cm 1.54 cm/m  AORTIC VALVE LVOT Vmax:   102.00 cm/s LVOT Vmean:  82.600 cm/s LVOT VTI:    0.162 m  AORTA Ao Root diam: 2.40 cm MITRAL VALVE               TRICUSPID VALVE MV Area (PHT): 5.42 cm    TR Peak grad:   13.4 mmHg MV Decel Time: 140 msec    TR Vmax:        183.00 cm/s MV E velocity: 92.10 cm/s MV A velocity: 52.30 cm/s  SHUNTS  MV E/A ratio:  1.76        Systemic VTI:  0.16 m                            Systemic Diam: 1.90 cm Chilton Si MD Electronically signed by Chilton Si MD Signature Date/Time: 01/25/2020/12:14:57 PM    Final (Updated)     Cardiac Studies   No new since last evaluation (post cath)  Patient Profile     30 y.o. female COVID-19 with COVID-19 Pericarditis and sepsis  Assessment & Plan    Acute Myopericarditis - CRP elevated, Troponin curve flat ~ 3000 - symptomatic - ECG with inferolateral ST elevations - Echo notable for mild LV and RV dysfunction - etiology from COVID-19 - Started on Ibuprofen 600 mg TID will take for two-4 weeks - Started on colchicine 0.6 mg but with weight of 65 kg decreasing dose to daily for ~ 3 months - deferring BB for residual CP in the setting of hypotension - Deferring anakinra at this time - Patient advised to limit physical activity to  walking and light weight training (< 5kg) for at least 3 months and competitive sports for at least 6 months - will arrange follow up peri-discharge - presently hypotensive but with improved hemodynamics from prior in the setting of COVID related sepsis; appreciate critical care following along  Discussed with patient and nursing  For questions or updates, please contact [facility] Please consult [email or web address] for contact info under Cardiology/STEMI.      Signed, Christell Constant, MD  01/26/2020, 7:58 AM

## 2020-01-26 NOTE — Progress Notes (Signed)
Pt's hypotensive through this hospitalization. Asked Dr. Natale Milch regarding what is parameter for MAP to call MD for hypotension. Dr. Natale Milch is okay to MAP 60 with asymptomatic. SBP is staying 70's -90's and MAP is staying higher than 65. Continue to monitor BP. Pt's oral intake is poor, encourage to eat food. HS [employer]

## 2020-01-26 NOTE — Plan of Care (Signed)
  Problem: Education: Goal: Knowledge of General Education information will improve Description: Including pain rating scale, medication(s)/side effects and non-pharmacologic comfort measures Outcome: Progressing   Problem: Health Behavior/Discharge Planning: Goal: Ability to manage health-related needs will improve Outcome: Progressing   Problem: Clinical Measurements: Goal: Ability to maintain clinical measurements within normal limits will improve Outcome: Progressing Goal: Will remain free from infection Outcome: Progressing Goal: Respiratory complications will improve Outcome: Progressing   Problem: Activity: Goal: Risk for activity intolerance will decrease Outcome: Progressing   Problem: Coping: Goal: Level of anxiety will decrease Outcome: Progressing   

## 2020-01-26 NOTE — Progress Notes (Signed)
PROGRESS NOTE    Katrina [name]  ZOX:[id number] DOB: 03-24-1990 DOA: 01/24/2020 PCP: Patient, No Pcp Per   Brief Narrative:  Katrina [name] is a 30 y.o. female with medical history significant of migraine headaches and chronic low back pain who presents with complaints of 6 days of fever and diarrhea.  She is not immunized against COVID-19.  Complains of associated symptoms of body aches, intermittent nonproductive cough, chest discomfort, shortness of breath, generalized crampy abdominal pain, and generalized weakness.  Denies any blood in emesis or diarrhea. She has not been recently on antibiotics or steroids.  Denies any tobacco, drug use, or daily drinking of alcohol.  Family history is significant for a brother in his 68s who has heart failure, mother suffers from tachycardia, and dad who died of a heart attack in his 23s. She reports previously being told that she had a heart murmur, but had not had any further work-up done in the past. Upon admission into the emergency department patient was seen to have fever up to 100.5 F, pulse 10 3-116, respirations 16-33, blood pressure 79/51-92/65 despite receiving 4 liters of IV fluids, and O2 saturations maintained on room air. COVID-19 screening was positive.  Chest x-ray was clear.  CT scan of the abdomen pelvis significant for inflammatory or infectious enterocolitis.  EKG abnormal, cardiology consulted, given elevated troponin patient was taken for cardiac catheterization which thankfully was unremarkable.  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 Elevated troponin/NSTEMI  - Status postcardiac cath which was noted to be unremarkable - Echo shows hypokinesis -  Follow-up blood cultures - preliminary negative - Check stool studies -pending - Check CTA negative for PE - Continue empiric antibiotics of metronidazole and Rocephin in case of underlying infection given elevated procalcitonin and CRP with persistent hypotension - Heparin drip  discontinued previously due to thrombocytopenia -Continue IV fluids -Cardiology  following we appreciate insight and recommendations  COVID-19 infection, POA.   -Questionably acute given ongoing enteritis and endocarditis as above -Patient is unvaccinated -Remains on room air -Remdesivir per pharmacy -Solu-Medrol IV  Hypokalemia/hypocalcemia: Acute, resolved -Currently wnl (Ca correct with low albumin)  Thrombocytopenia: Acute.   - Platelet count 92 on admission.  Patient denied any reports of bleeding. -Likely in the setting of acute viral infection as above  DVT prophylaxis:  Early ambulation, SCDs in the setting of thrombocytopenia Code Status: Full Family Communication: None available, fianc did not answer the phone  Status is: Inpatient  Dispo: The patient is from: Home              Anticipated d/c is to: Home              Anticipated d/c date is: >46h              Patient currently not medically stable for discharge  Consultants:   Cardiology  Procedures:   Cardiac cath 01/25/2020  Antimicrobials:  Remdesivir, metronidazole, ceftriaxone  Subjective: No acute issues or events overnight denies chest pain, shortness of breath, constipation, headache, fevers, chills.  Patient does report nausea and general malaise fatigue and weakness.  Objective: Vitals:   01/25/20 2255 01/25/20 2345 01/26/20 0250 01/26/20 0300  BP:  (!) 78/55 (!) 83/57 Marland Kitchen)  89/61  Pulse: (!) 118 (!) 117 (!) 115 (!) 114  Resp: (!) 26 (!) 24 (!) 36 (!) 32  Temp:  (!) 100.5 F (38.1 C) (!) 102.8 F (39.3 C) 99.9 F (37.7 C)  TempSrc:  Oral Oral Oral  SpO2: 92% 95% 92% 94%  Weight:      Height:         Intake/Output Summary (Last 24 hours) at 01/26/2020 0727 Last data filed at 01/26/2020 0600 Gross per 24 hour  Intake 3011.5 ml  Output --  Net 3011.5 ml   Filed Weights   01/24/20 2319  Weight: 65.3 kg    Examination:  General:  Pleasantly resting in bed, No acute distress. HEENT:  Normocephalic atraumatic.  Sclerae nonicteric, noninjected.  Extraocular movements intact bilaterally. Neck:  Without mass or deformity.  Trachea is midline. Lungs:  Clear to auscultate bilaterally without rhonchi, wheeze, or rales. Heart:  Regular rate and rhythm.  Without murmurs, rubs, or gallops. Abdomen:  Soft, minimally tender diffusely, nondistended.  Without guarding or rebound. Extremities: Without cyanosis, clubbing, edema, or obvious deformity. Vascular:  Dorsalis pedis and posterior tibial pulses palpable bilaterally. Skin:  Warm and dry, no erythema, no ulcerations.   Data Reviewed: I have personally reviewed following labs and imaging studies  CBC: Recent Labs  Lab 01/24/20 2320 01/25/20 1232 01/25/20 1548 01/26/20 0119  WBC 13.2* 10.6*  --  8.9  NEUTROABS 12.4* 9.7*  --  8.0*  HGB 14.0 11.9* 10.9* 11.1*  HCT 40.5 35.5* 32.0* 32.6*  MCV 86.7 87.9  --  86.0  PLT 92* 84*  --  91*   Basic Metabolic Panel: Recent Labs  Lab 01/24/20 2320 01/25/20 1232 01/25/20 1548 01/26/20 0119  NA 132* 133* 135 131*  K 3.4* 3.2* 3.2* 3.7  CL 94* 98  --  99  CO2 25 24  --  22  GLUCOSE 96 100*  --  93  BUN 21* 13  --  11  CREATININE 0.90 0.77  --  0.78  CALCIUM 7.9* 7.8*  --  7.4*  MG  --  2.0  --  2.1  PHOS  --   --   --  2.0*   GFR: Estimated Creatinine Clearance: 99.1 mL/min (by C-G formula based on SCr of 0.78 mg/dL). Liver Function Tests: Recent Labs  Lab 01/24/20 2320 01/26/20 0119  AST 42* 95*  ALT 34 36  ALKPHOS 89 92  BILITOT 1.2 1.1  PROT 6.9 5.1*  ALBUMIN 3.0* 2.1*   Recent Labs  Lab 01/24/20 2320  LIPASE 21   No results for input(s): AMMONIA in the last  168 hours. Coagulation Profile: No results for input(s): INR, PROTIME in the last 168 hours. Cardiac Enzymes: No results for input(s): CKTOTAL, CKMB, CKMBINDEX, TROPONINI in the last 168 hours. BNP (last 3 results) No results for input(s): PROBNP in the last 8760 hours. HbA1C: No results for input(s): HGBA1C in the last 72 hours. CBG: No results for input(s): GLUCAP in the last 168 hours. Lipid Profile: Recent Labs    01/25/20 0354  TRIG 263*   Thyroid Function Tests: No results for input(s): TSH, T4TOTAL, FREET4, T3FREE, THYROIDAB in the last 72 hours. Anemia Panel: Recent Labs    01/25/20 0354 01/26/20 0119  FERRITIN 136 272   Sepsis Labs: Recent Labs  Lab 01/25/20 0354 01/25/20 1002  PROCALCITON 4.96  --   LATICACIDVEN 1.7 1.8    Recent Results (from the past 240 hour(s))  SARS Coronavirus 2 by RT  PCR (hospital order, performed in [facility] hospital lab) Nasopharyngeal Nasopharyngeal Swab     Status: Abnormal   Collection Time: 01/24/20 11:20 PM   Specimen: Nasopharyngeal Swab  Result Value Ref Range Status   SARS Coronavirus 2 POSITIVE (A) NEGATIVE Final    Comment: RESULT CALLED TO, READ BACK BY AND VERIFIED WITH: KELLIE NEAL RN ON 01/25/20 AT 0102 [facility] (NOTE) SARS-CoV-2 target nucleic acids are DETECTED  SARS-CoV-2 RNA is generally detectable in upper respiratory specimens  during the acute phase of infection.  Positive results are indicative  of the presence of the identified virus, but do not rule out bacterial infection or co-infection with other pathogens not detected by the test.  Clinical correlation with patient history and  other diagnostic information is necessary to determine patient infection status.  The expected result is negative.  Fact Sheet for Patients:   BoilerBrush.com.cy   Fact Sheet for Healthcare Providers:   https://pope.com/    This test is not yet approved or cleared by the [place] FDA and  has been authorized for detection and/or diagnosis of SARS-CoV-2 by FDA under an Emergency Use Authorization (EUA).  This EUA will remain in effect (meaning this  test can be used) for the duration of  the COVID-19 declaration under Section 564(b)(1) of the Act, 21 U.S.C. section 360-bbb-3(b)(1), unless the authorization is terminated or revoked sooner.  Performed at [facility], [address] [place], [place] [address]   Blood Culture (routine x 2)     Status: None (Preliminary result)   Collection Time: 01/25/20  3:54 AM   Specimen: BLOOD  Result Value Ref Range Status   Specimen Description BLOOD LEFT ANTECUBITAL  Final   Special Requests   Final    BOTTLES DRAWN AEROBIC AND ANAEROBIC Blood Culture results may not be optimal due to an excessive volume of blood received in culture bottles Performed at [facility], [address], [place], [place] [address]    Culture PENDING  Incomplete   Report Status PENDING  Incomplete  Blood Culture (routine x 2)     Status: None (Preliminary result)   Collection Time: 01/25/20  3:54 AM   Specimen: BLOOD LEFT HAND  Result Value Ref Range Status   Specimen Description BLOOD LEFT HAND  Final   Special Requests   Final    BOTTLES DRAWN AEROBIC AND ANAEROBIC Blood Culture adequate volume Performed at [facility], [address], [place], [place] [address]    Culture PENDING  Incomplete   Report Status PENDING  Incomplete         Radiology Studies: CT ANGIO CHEST PE W OR WO CONTRAST  Result Date: 01/25/2020 CLINICAL DATA:  COVID-19 infection, shortness of breath, cough, fever and vomiting. Elevated D-dimer and elevated troponin I levels. EXAM: CT ANGIOGRAPHY CHEST WITH CONTRAST TECHNIQUE: Multidetector CT imaging of the chest was performed using the standard protocol during bolus administration of intravenous contrast. Multiplanar CT image reconstructions and MIPs were obtained to evaluate the  vascular anatomy. CONTRAST:  OMNIPAQUE IOHEXOL 350 MG/ML SOLN COMPARISON:  Chest x-ray earlier today. FINDINGS: Cardiovascular: Pulmonary arteries are well opacified and of normal caliber. No evidence of pulmonary embolism. The thoracic aorta is normal in caliber. The heart size is top normal. Suggestion possibly mild dilatation of the left ventricle by CT. No pericardial fluid identified. No visible calcified coronary artery plaque. Mediastinum/Nodes: No enlarged mediastinal, hilar, or axillary lymph nodes. Thyroid gland, trachea, and  esophagus demonstrate no significant findings. Lungs/Pleura: Trace pleural fluid in the inferior right hemithorax. Atelectasis and scarring along the inferior aspect of the left major fissure extending into the inferior lingula. Mild atelectasis versus subtle infiltrate at the posterior right lung base. Otherwise no significant airspace disease or widespread evidence of COVID pneumonia. Tiny nonspecific 3 mm nodule in the right middle lobe is likely benign. No overt pulmonary edema or pneumothorax. Upper Abdomen: No acute abnormality. Musculoskeletal: No bony abnormalities. The body wall soft tissues of the chest and visualized upper abdomen demonstrate suggestion edema in the body wall fat suggestive of anasarca. Review of the MIP images confirms the above findings. IMPRESSION: 1. No evidence of pulmonary embolism. 2. Mild atelectasis versus subtle infiltrate at the posterior right lung base. Otherwise no significant airspace disease or evidence of significant COVID pneumonia. 3. Trace pleural fluid in the inferior right hemithorax. 4. Suggestion of possibly mild dilatation of the left ventricle by CT. 5. Suggestion of edema in the body wall fat suggestive of anasarca. 6. Small nonspecific 3 mm nodule in the right middle lobe is likely benign/postinflammatory. Electronically Signed   By: Irish Lack M.D.   On: 01/25/2020 13:28   CT ABDOMEN PELVIS W CONTRAST  Result  Date: 01/25/2020 CLINICAL DATA:  Patient with COVID symptoms including fever vomiting and diarrhea and body aches. EXAM: CT ABDOMEN AND PELVIS WITH CONTRAST TECHNIQUE: Multidetector CT imaging of the abdomen and pelvis was performed using the standard protocol following bolus administration of intravenous contrast. CONTRAST:  68mL OMNIPAQUE IOHEXOL 300 MG/ML  SOLN COMPARISON:  None. FINDINGS: Lower chest: There is an area of atelectasis in the lingula. There is a small right-sided pleural effusion. There is a trace pericardial effusion.The heart size is normal. Hepatobiliary: The liver is normal. Normal gallbladder.There is no biliary ductal dilation. Pancreas: Normal contours without ductal dilatation. No peripancreatic fluid collection. Spleen: Unremarkable. Adrenals/Urinary Tract: --Adrenal glands: Unremarkable. --Right kidney/ureter: No hydronephrosis or radiopaque kidney stones. --Left kidney/ureter: No hydronephrosis or radiopaque kidney stones. --Urinary bladder: Unremarkable. Stomach/Bowel: --Stomach/Duodenum: No hiatal hernia or other gastric abnormality. Normal duodenal course and caliber. --Small bowel: There are mildly dilated fluid-filled loops of small bowel scattered throughout abdomen, several which demonstrate hyperenhancement and mild wall thickening. --Colon: There appears to be some mild wall thickening of the colon and rectum. There is liquid stool throughout the colon. --Appendix: Normal. Vascular/Lymphatic: Normal course and caliber of the major abdominal vessels. --No retroperitoneal lymphadenopathy. --No mesenteric lymphadenopathy. --No pelvic or inguinal lymphadenopathy. Reproductive: Unremarkable Other: There is a small volume of free fluid in the abdomen pelvis. The abdominal wall is normal. Musculoskeletal. No acute displaced fractures. IMPRESSION: 1. There are mildly dilated fluid-filled loops of small bowel scattered throughout abdomen, several which demonstrate hyperenhancement and  mild wall thickening. There is some mild wall thickening of the colon. Findings are concerning for infectious or inflammatory enterocolitis. 2. Small volume of free fluid in the abdomen pelvis. 3. Small right-sided pleural effusion. 4. Trace pericardial effusion. Electronically Signed   By: Katherine Mantle M.D.   On: 01/25/2020 04:58   CARDIAC CATHETERIZATION  Result Date: 01/25/2020 1. No angiographic evidence of CAD 2. Normal filling pressures 3. Elevated troponin likely due demand ischemia from viral syndrome/hypotension/dehydration. Recommendations: No further ischemic evaluation.   DG Chest Portable 1 View  Result Date: 01/25/2020 CLINICAL DATA:  Shortness of breath EXAM: PORTABLE CHEST 1 VIEW COMPARISON:  None. FINDINGS: The heart size and mediastinal contours are within normal limits. Both lungs are clear. The  visualized skeletal structures are unremarkable. IMPRESSION: No active disease. Electronically Signed   By: Jonna Clark M.D.   On: 01/25/2020 00:36   ECHOCARDIOGRAM LIMITED  Result Date: 01/25/2020    ECHOCARDIOGRAM LIMITED REPORT   Patient Name:   ZAILYNN BRANDEL Date of Exam: 01/25/2020 Medical Rec #:  [id number]     Height:       67.0 in Accession #:    [id number]    Weight:       144.0 lb Date of Birth:  08/24/1990     BSA:          1.759 m Patient Age:    29 years      BP:           85/56 mmHg Patient Gender: F             HR:           102 bpm. Exam Location:  Inpatient Procedure: Cardiac Doppler, Limited Echo and Limited Color Doppler                                MODIFIED REPORT:     This report was modified by Chilton Si MD on 01/25/2020 due to RV                                  hypokinesis.  Indications:     Elevated Troponin  History:         Patient has no prior history of Echocardiogram examinations.  Sonographer:     Eulah Pont RDCS Referring Phys:  Judye Bos DUNN Diagnosing Phys: Chilton Si MD IMPRESSIONS  1. Posterolateral hypokinesis. Hypokinesis of the  basal to mid regions with apical sparing. 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 Left ventricular ejection fraction, by estimation, is 40 to 45%. The left ventricle has mildly decreased function. The left ventricle demonstrates regional wall motion abnormalities (see scoring diagram/findings for description). Left ventricular diastolic parameters are consistent with Grade II diastolic dysfunction (pseudonormalization).  2. Hypokinesis of the apical RV. Right ventricular systolic function is mildly reduced. There is normal pulmonary artery systolic pressure.  3. A small pericardial effusion is present.  4. Mild mitral valve regurgitation.  5. The aortic valve is tricuspid.  6. The inferior vena cava is normal in size with <50% respiratory variability, suggesting right atrial pressure of 8 mmHg. FINDINGS  Left Ventricle: Posterolateral hypokinesis. Hypokinesis of the basal to mid regions with apical sparing. 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 Left ventricular ejection fraction, by estimation, is 40 to 45%. The left ventricle has mildly  decreased function. The left ventricle demonstrates regional wall motion abnormalities. Left ventricular diastolic parameters are consistent with Grade II diastolic dysfunction (pseudonormalization). Right Ventricle: Hypokinesis of the apical RV. Right ventricular systolic function is mildly reduced. There is normal pulmonary artery systolic pressure. The tricuspid regurgitant velocity is 1.83 m/s, and with an assumed right atrial pressure of 8 mmHg,  the estimated right ventricular systolic pressure is 21.4 mmHg. Pericardium: A small pericardial effusion is present. Mitral Valve: Mild mitral valve regurgitation. Tricuspid Valve: Tricuspid valve regurgitation is mild. Aortic Valve: The aortic valve is tricuspid. Pulmonic Valve: Pulmonic valve regurgitation is trivial. Venous: The inferior vena cava is normal in size with less than 50% respiratory  variability, suggesting right atrial pressure of 8 mmHg. LEFT VENTRICLE PLAX  2D LVIDd:         4.20 cm     Diastology LVIDs:         3.10 cm     LV e' medial:    9.37 cm/s LV PW:         0.70 cm     LV E/e' medial:  9.8 LV IVS:        0.60 cm     LV e' lateral:   8.67 cm/s LVOT diam:     1.90 cm     LV E/e' lateral: 10.6 LV SV:         46 LV SV Index:   26 LVOT Area:     2.84 cm  LV Volumes (MOD) LV vol d, MOD A2C: 78.1 ml LV vol d, MOD A4C: 74.4 ml LV vol s, MOD A2C: 39.9 ml LV vol s, MOD A4C: 39.1 ml LV SV MOD A2C:     38.2 ml LV SV MOD A4C:     74.4 ml LV SV MOD BP:      37.1 ml RIGHT VENTRICLE RV S prime:     10.70 cm/s TAPSE (M-mode): 1.8 cm LEFT ATRIUM         Index LA diam:    2.70 cm 1.54 cm/m  AORTIC VALVE LVOT Vmax:   102.00 cm/s LVOT Vmean:  82.600 cm/s LVOT VTI:    0.162 m  AORTA Ao Root diam: 2.40 cm MITRAL VALVE               TRICUSPID VALVE MV Area (PHT): 5.42 cm    TR Peak grad:   13.4 mmHg MV Decel Time: 140 msec    TR Vmax:        183.00 cm/s MV E velocity: 92.10 cm/s MV A velocity: 52.30 cm/s  SHUNTS MV E/A ratio:  1.76        Systemic VTI:  0.16 m                            Systemic Diam: 1.90 cm Chilton Siiffany Moran MD Electronically signed by Chilton Siiffany Sublette MD Signature Date/Time: 01/25/2020/12:14:57 PM    Final (Updated)         Scheduled Meds: . albuterol  2 puff Inhalation Q6H  . vitamin C  500 mg Oral Daily  . colchicine  0.6 mg Oral BID  . famotidine  20 mg Oral BID  . ibuprofen  600 mg Oral TID WC  . methylPREDNISolone (SOLU-MEDROL) injection  0.5 mg/kg Intravenous Q12H  . sodium chloride flush  3 mL Intravenous Q12H  . zinc sulfate  220 mg Oral Daily   Continuous Infusions: . sodium chloride    . cefTRIAXone (ROCEPHIN)  IV 2 g (01/25/20 2004)  . lactated ringers Stopped (01/25/20 0510)  . lactated ringers 200 mL/hr at 01/25/20 0735  . metronidazole 500 mg (01/26/20 0256)  . remdesivir 100 mg in NS 100 mL    . sodium chloride       LOS: 1 day   Time spent:  40min  Azucena FallenWilliam C Harryette Shuart, DO [facility]  If 7PM-7AM, please contact night-coverage [email or web address]  01/26/2020, 7:27 AM

## 2020-01-27 DIAGNOSIS — U071 COVID-19: Secondary | ICD-10-CM | POA: Diagnosis not present

## 2020-01-27 DIAGNOSIS — I400 Infective myocarditis: Secondary | ICD-10-CM | POA: Diagnosis not present

## 2020-01-27 DIAGNOSIS — I214 Non-ST elevation (NSTEMI) myocardial infarction: Secondary | ICD-10-CM | POA: Diagnosis not present

## 2020-01-27 LAB — CBC WITH DIFFERENTIAL/PLATELET
Abs Immature Granulocytes: 0.18 10*3/uL — ABNORMAL HIGH (ref 0.00–0.07)
Basophils Absolute: 0 10*3/uL (ref 0.0–0.1)
Basophils Relative: 0 %
Eosinophils Absolute: 0 10*3/uL (ref 0.0–0.5)
Eosinophils Relative: 0 %
HCT: 33 % — ABNORMAL LOW (ref 36.0–46.0)
Hemoglobin: 11.5 g/dL — ABNORMAL LOW (ref 12.0–15.0)
Immature Granulocytes: 1 %
Lymphocytes Relative: 8 %
Lymphs Abs: 1 10*3/uL (ref 0.7–4.0)
MCH: 29.6 pg (ref 26.0–34.0)
MCHC: 34.8 g/dL (ref 30.0–36.0)
MCV: 85.1 fL (ref 80.0–100.0)
Monocytes Absolute: 0.7 10*3/uL (ref 0.1–1.0)
Monocytes Relative: 5 %
Neutro Abs: 11.2 10*3/uL — ABNORMAL HIGH (ref 1.7–7.7)
Neutrophils Relative %: 86 %
Platelets: 92 10*3/uL — ABNORMAL LOW (ref 150–400)
RBC: 3.88 MIL/uL (ref 3.87–5.11)
RDW: 14.5 % (ref 11.5–15.5)
WBC: 13.1 10*3/uL — ABNORMAL HIGH (ref 4.0–10.5)
nRBC: 0 % (ref 0.0–0.2)

## 2020-01-27 LAB — COMPREHENSIVE METABOLIC PANEL
ALT: 32 U/L (ref 0–44)
AST: 32 U/L (ref 15–41)
Albumin: 2.1 g/dL — ABNORMAL LOW (ref 3.5–5.0)
Alkaline Phosphatase: 99 U/L (ref 38–126)
Anion gap: 12 (ref 5–15)
BUN: 22 mg/dL — ABNORMAL HIGH (ref 6–20)
CO2: 19 mmol/L — ABNORMAL LOW (ref 22–32)
Calcium: 7.8 mg/dL — ABNORMAL LOW (ref 8.9–10.3)
Chloride: 104 mmol/L (ref 98–111)
Creatinine, Ser: 0.8 mg/dL (ref 0.44–1.00)
GFR, Estimated: >60 mL/min (ref >60)
Glucose, Bld: 120 mg/dL — ABNORMAL HIGH (ref 70–99)
Potassium: 3.7 mmol/L (ref 3.5–5.1)
Sodium: 135 mmol/L (ref 135–145)
Total Bilirubin: 0.5 mg/dL (ref 0.3–1.2)
Total Protein: 5.2 g/dL — ABNORMAL LOW (ref 6.5–8.1)

## 2020-01-27 LAB — URINALYSIS, ROUTINE W REFLEX MICROSCOPIC
Bilirubin Urine: NEGATIVE
Glucose, UA: NEGATIVE mg/dL
Hgb urine dipstick: NEGATIVE
Ketones, ur: NEGATIVE mg/dL
Nitrite: NEGATIVE
Protein, ur: NEGATIVE mg/dL
Specific Gravity, Urine: 1.03 (ref 1.005–1.030)
pH: 6 (ref 5.0–8.0)

## 2020-01-27 LAB — C-REACTIVE PROTEIN: CRP: 18.3 mg/dL — ABNORMAL HIGH (ref <1.0)

## 2020-01-27 LAB — PHOSPHORUS: Phosphorus: 1.8 mg/dL — ABNORMAL LOW (ref 2.5–4.6)

## 2020-01-27 LAB — MAGNESIUM: Magnesium: 2.5 mg/dL — ABNORMAL HIGH (ref 1.7–2.4)

## 2020-01-27 LAB — D-DIMER, QUANTITATIVE: D-Dimer, Quant: 2.36 ug/mL-FEU — ABNORMAL HIGH (ref 0.00–0.50)

## 2020-01-27 LAB — FERRITIN: Ferritin: 183 ng/mL (ref 11–307)

## 2020-01-27 MED ORDER — ALBUTEROL SULFATE HFA 108 (90 BASE) MCG/ACT IN AERS
2.0000 | INHALATION_SPRAY | Freq: Four times a day (QID) | RESPIRATORY_TRACT | Status: DC
  Administered 2020-01-27 – 2020-01-29 (×6): 2 via RESPIRATORY_TRACT
  Filled 2020-01-27: qty 6.7

## 2020-01-27 NOTE — Progress Notes (Signed)
Noted referral for [place] cost- benefits check submitted - will be processed on Monday during normal business hours.

## 2020-01-27 NOTE — Progress Notes (Signed)
Progress Note  Patient Name: Katrina [name] Date of Encounter: 01/27/2020  Primary Cardiologist: Izora Ribas- New  Subjective   30 yo F with persistent CP in the setting of COVID-19 with mild LV dysfunction with likely myopericarditis.  Widely Patent CORS 01/25/20.  CTPE negative.    Patient notes that she is doing better.  Since day prior notes improvement in pain.  Relevant interval testing or therapy include elevated white count but improved CRP.    No chest pain or pressure .  No SOB/DOE and no PND/Orthopnea.  No weight gain or leg swelling.  No palpitations or syncope .   Inpatient Medications    Scheduled Meds: . albuterol  2 puff Inhalation Q6H  . vitamin C  500 mg Oral Daily  . colchicine  0.6 mg Oral Daily  . famotidine  20 mg Oral BID  . ibuprofen  600 mg Oral TID WC  . methylPREDNISolone (SOLU-MEDROL) injection  0.5 mg/kg Intravenous Q12H  . mupirocin ointment   Nasal BID  . sodium chloride flush  3 mL Intravenous Q12H  . zinc sulfate  220 mg Oral Daily   Continuous Infusions: . sodium chloride    . cefTRIAXone (ROCEPHIN)  IV 2 g (01/26/20 1117)  . lactated ringers Stopped (01/25/20 0510)  . lactated ringers 200 mL/hr at 01/25/20 0735  . metronidazole 500 mg (01/27/20 0338)  . remdesivir 100 mg in NS 100 mL 100 mg (01/26/20 1041)  . sodium chloride     PRN Meds: sodium chloride, acetaminophen, chlorpheniramine-HYDROcodone, guaiFENesin-dextromethorphan, loperamide, ondansetron **OR** ondansetron (ZOFRAN) IV, oxyCODONE-acetaminophen, sodium chloride flush   Vital Signs    Vitals:   01/26/20 1937 01/26/20 2300 01/26/20 2326 01/27/20 0335  BP: (!) 88/66  93/61 (!) 87/70  Pulse: (!) 102 78 76   Resp: (!) 22 18 20 18   Temp: 98.8 F (37.1 C)  98.7 F (37.1 C) 98.5 F (36.9 C)  TempSrc: Oral  Oral Oral  SpO2: 94% 95% 95%   Weight:      Height:        Intake/Output Summary (Last 24 hours) at 01/27/2020 0759 Last data filed at 01/27/2020 01/29/2020 Gross per 24  hour  Intake 546 ml  Output -  Net 546 ml   Filed Weights   01/24/20 2319  Weight: 65.3 kg    Telemetry    Sinus Rhythm- Personally Reviewed  ECG    No new- Personally Reviewed  Physical Exam   GEN: No acute distress.   Neck: No JVD Cardiac: RRR, no murmurs, rubs, or gallops. Respiratory: Coarse breath sounds bilaterally GI: Soft, nontender, non-distended  MS: No edema; No deformity. Neuro:  Nonfocal  Psych: Normal affect   Labs    Chemistry Recent Labs  Lab 01/24/20 2320 01/25/20 1232 01/25/20 1548 01/26/20 0119 01/27/20 0111  NA 132* 133* 135 131* 135  K 3.4* 3.2* 3.2* 3.7 3.7  CL 94* 98  --  99 104  CO2 25 24  --  22 19*  GLUCOSE 96 100*  --  93 120*  BUN 21* 13  --  11 22*  CREATININE 0.90 0.77  --  0.78 0.80  CALCIUM 7.9* 7.8*  --  7.4* 7.8*  PROT 6.9  --   --  5.1* 5.2*  ALBUMIN 3.0*  --   --  2.1* 2.1*  AST 42*  --   --  95* 32  ALT 34  --   --  36 32  ALKPHOS 89  --   --  92 99  BILITOT 1.2  --   --  1.1 0.5  GFRNONAA >60 >60  --  >60 >60  ANIONGAP 13 11  --  10 12     Hematology Recent Labs  Lab 01/25/20 1232 01/25/20 1548 01/26/20 0119 01/27/20 0111  WBC 10.6*  --  8.9 13.1*  RBC 4.04  --  3.79* 3.88  HGB 11.9* 10.9* 11.1* 11.5*  HCT 35.5* 32.0* 32.6* 33.0*  MCV 87.9  --  86.0 85.1  MCH 29.5  --  29.3 29.6  MCHC 33.5  --  34.0 34.8  RDW 14.2  --  14.3 14.5  PLT 84*  --  91* 92*    Cardiac EnzymesNo results for input(s): TROPONINI in the last 168 hours. No results for input(s): TROPIPOC in the last 168 hours.   BNPNo results for input(s): BNP, PROBNP in the last 168 hours.   DDimer  Recent Labs  Lab 01/25/20 0354 01/26/20 0119 01/27/20 0111  DDIMER 2.45* 3.36* 2.36*     Radiology    CT ANGIO CHEST PE W OR WO CONTRAST  Result Date: 01/25/2020 CLINICAL DATA:  COVID-19 infection, shortness of breath, cough, fever and vomiting. Elevated D-dimer and elevated troponin I levels. EXAM: CT ANGIOGRAPHY CHEST WITH CONTRAST  TECHNIQUE: Multidetector CT imaging of the chest was performed using the standard protocol during bolus administration of intravenous contrast. Multiplanar CT image reconstructions and MIPs were obtained to evaluate the vascular anatomy. CONTRAST:  100mL OMNIPAQUE IOHEXOL 350 MG/ML SOLN COMPARISON:  Chest x-ray earlier today. FINDINGS: Cardiovascular: Pulmonary arteries are well opacified and of normal caliber. No evidence of pulmonary embolism. The thoracic aorta is normal in caliber. The heart size is top normal. Suggestion possibly mild dilatation of the left ventricle by CT. No pericardial fluid identified. No visible calcified coronary artery plaque. Mediastinum/Nodes: No enlarged mediastinal, hilar, or axillary lymph nodes. Thyroid gland, trachea, and esophagus demonstrate no significant findings. Lungs/Pleura: Trace pleural fluid in the inferior right hemithorax. Atelectasis and scarring along the inferior aspect of the left major fissure extending into the inferior lingula. Mild atelectasis versus subtle infiltrate at the posterior right lung base. Otherwise no significant airspace disease or widespread evidence of COVID pneumonia. Tiny nonspecific 3 mm nodule in the right middle lobe is likely benign. No overt pulmonary edema or pneumothorax. Upper Abdomen: No acute abnormality. Musculoskeletal: No bony abnormalities. The body wall soft tissues of the chest and visualized upper abdomen demonstrate suggestion edema in the body wall fat suggestive of anasarca. Review of the MIP images confirms the above findings. IMPRESSION: 1. No evidence of pulmonary embolism. 2. Mild atelectasis versus subtle infiltrate at the posterior right lung base. Otherwise no significant airspace disease or evidence of significant COVID pneumonia. 3. Trace pleural fluid in the inferior right hemithorax. 4. Suggestion of possibly mild dilatation of the left ventricle by CT. 5. Suggestion of edema in the body wall fat suggestive of  anasarca. 6. Small nonspecific 3 mm nodule in the right middle lobe is likely benign/postinflammatory. Electronically Signed   By: Irish LackGlenn  Yamagata M.D.   On: 01/25/2020 13:28   CARDIAC CATHETERIZATION  Result Date: 01/25/2020 1. No angiographic evidence of CAD 2. Normal filling pressures 3. Elevated troponin likely due demand ischemia from viral syndrome/hypotension/dehydration. Recommendations: No further ischemic evaluation.   ECHOCARDIOGRAM LIMITED  Result Date: 01/25/2020    ECHOCARDIOGRAM LIMITED REPORT   Patient Name:   Lenise HeraldKERRY Schellinger Date of Exam: 01/25/2020 Medical Rec #:  [id number]     Height:  67.0 in Accession #:    [id number]    Weight:       144.0 lb Date of Birth:  22-Oct-1990     BSA:          1.759 m Patient Age:    29 years      BP:           85/56 mmHg Patient Gender: F             HR:           102 bpm. Exam Location:  Inpatient Procedure: Cardiac Doppler, Limited Echo and Limited Color Doppler                                MODIFIED REPORT:     This report was modified by Chilton Si MD on 01/25/2020 due to RV                                  hypokinesis.  Indications:     Elevated Troponin  History:         Patient has no prior history of Echocardiogram examinations.  Sonographer:     Eulah Pont RDCS Referring Phys:  Judye Bos DUNN Diagnosing Phys: Chilton Si MD IMPRESSIONS  1. Posterolateral hypokinesis. Hypokinesis of the basal to mid regions with apical sparing. 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 Left ventricular ejection fraction, by estimation, is 40 to 45%. The left ventricle has mildly decreased function. The left ventricle demonstrates regional wall motion abnormalities (see scoring diagram/findings for description). Left ventricular diastolic parameters are consistent with Grade II diastolic dysfunction (pseudonormalization).  2. Hypokinesis of the apical RV. Right ventricular systolic function is mildly reduced. There is normal pulmonary  artery systolic pressure.  3. A small pericardial effusion is present.  4. Mild mitral valve regurgitation.  5. The aortic valve is tricuspid.  6. The inferior vena cava is normal in size with <50% respiratory variability, suggesting right atrial pressure of 8 mmHg. FINDINGS  Left Ventricle: Posterolateral hypokinesis. Hypokinesis of the basal to mid regions with apical sparing. 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 Left ventricular ejection fraction, by estimation, is 40 to 45%. The left ventricle has mildly  decreased function. The left ventricle demonstrates regional wall motion abnormalities. Left ventricular diastolic parameters are consistent with Grade II diastolic dysfunction (pseudonormalization). Right Ventricle: Hypokinesis of the apical RV. Right ventricular systolic function is mildly reduced. There is normal pulmonary artery systolic pressure. The tricuspid regurgitant velocity is 1.83 m/s, and with an assumed right atrial pressure of 8 mmHg,  the estimated right ventricular systolic pressure is 21.4 mmHg. Pericardium: A small pericardial effusion is present. Mitral Valve: Mild mitral valve regurgitation. Tricuspid Valve: Tricuspid valve regurgitation is mild. Aortic Valve: The aortic valve is tricuspid. Pulmonic Valve: Pulmonic valve regurgitation is trivial. Venous: The inferior vena cava is normal in size with less than 50% respiratory variability, suggesting right atrial pressure of 8 mmHg. LEFT VENTRICLE PLAX 2D LVIDd:         4.20 cm     Diastology LVIDs:         3.10 cm     LV e' medial:    9.37 cm/s LV PW:         0.70 cm     LV E/e' medial:  9.8 LV IVS:  0.60 cm     LV e' lateral:   8.67 cm/s LVOT diam:     1.90 cm     LV E/e' lateral: 10.6 LV SV:         46 LV SV Index:   26 LVOT Area:     2.84 cm  LV Volumes (MOD) LV vol d, MOD A2C: 78.1 ml LV vol d, MOD A4C: 74.4 ml LV vol s, MOD A2C: 39.9 ml LV vol s, MOD A4C: 39.1 ml LV SV MOD A2C:     38.2 ml LV SV MOD A4C:      74.4 ml LV SV MOD BP:      37.1 ml RIGHT VENTRICLE RV S prime:     10.70 cm/s TAPSE (M-mode): 1.8 cm LEFT ATRIUM         Index LA diam:    2.70 cm 1.54 cm/m  AORTIC VALVE LVOT Vmax:   102.00 cm/s LVOT Vmean:  82.600 cm/s LVOT VTI:    0.162 m  AORTA Ao Root diam: 2.40 cm MITRAL VALVE               TRICUSPID VALVE MV Area (PHT): 5.42 cm    TR Peak grad:   13.4 mmHg MV Decel Time: 140 msec    TR Vmax:        183.00 cm/s MV E velocity: 92.10 cm/s MV A velocity: 52.30 cm/s  SHUNTS MV E/A ratio:  1.76        Systemic VTI:  0.16 m                            Systemic Diam: 1.90 cm Chilton Si MD Electronically signed by Chilton Si MD Signature Date/Time: 01/25/2020/12:14:57 PM    Final (Updated)     Cardiac Studies   No new since last evaluation (post cath)  Patient Profile     30 y.o. female COVID-19 with COVID-19 Pericarditis and sepsis  Assessment & Plan    Acute Myopericarditis - CRP elevated, Troponin curve flat ~ 3000 - asymptomatic - ECG with inferolateral ST elevations - Echo notable for mild LV and RV dysfunction - etiology from COVID-19 - Started on Ibuprofen 600 mg TID will take for two-4 weeks - Colchicine 0.6 mg daily for ~ 3 months - Patient advised to limit physical activity to walking and light weight training (< 5kg) for at least 3 months and competitive sports for at least 6 months - with improvement in biomarkers  [facility] will sign off.  Medication Recommendations:  Ibuprofen and colchicine as above Other recommendations (labs, testing, etc):  Echocardiogram after course of colchicine Follow up as an outpatient:  We will arrange f/u  Discussed with patient and her family  For questions or updates, please contact [facility] Please consult [email or web address] for contact info under Cardiology/STEMI.      Signed, Christell Constant, MD  01/27/2020, 7:59 AM

## 2020-01-27 NOTE — Progress Notes (Signed)
PROGRESS NOTE    Lenise HeraldKerry Sparkman  ZOX:[id number] DOB: Jun 27, 1990 DOA: 01/24/2020 PCP: Patient, No Pcp Per   Brief Narrative:   Nash DimmerKerry Hamptonis a 30 y.o.femalewith medical history significant ofmigraine headaches and chronic low back pain who presents with complaints of 6 days offever and diarrhea.She isnot immunized against COVID-19. Complains of associated symptoms of body aches, intermittent nonproductive cough, chest discomfort, shortness of breath, generalized crampy abdominal pain, and generalized weakness. Denies any blood in emesis or diarrhea. She has not been recently on antibiotics or steroids. Denies any tobacco, drug use, or daily drinking of alcohol. Family history is significant for a brother in his 1540s who has heart failure, mother suffers from tachycardia, and dad who died of a heart attack in his 1840s. Shereports previously being told that she had a heart murmur, but had not had any further work-up done in the past. Upon admission into the emergency department patient was seen to have fever up to 100.5 F, pulse 10 3-116, respirations 16-33, blood pressure 79/51-92/65 despite receiving4liters of IV fluids, and O2 saturationsmaintained on room air. COVID-19 screening was positive. Chest x-ray was clear. CT scan of the abdomen pelvis significant for inflammatory or infectious enterocolitis.  EKG abnormal, cardiology consulted, given elevated troponin patient was taken for cardiac catheterization which thankfully was unremarkable.  01/27/20: Still feels weak with ambulation, pleuritic pain w/ cough.    Assessment & Plan:  Acute Myopericarditis Elevated troponin/NSTEMI     - appreciate cards assistance     - s/p cardiac cath; unrremarkable     - started on ibuprofen 600mg  TID for 2 - 4 weeks; colchicine 0.6mg  daily for 3 months     - cards rec limiting physical activity to walking and light wt training (<5kg) for at least 3 months     - BB deffered d/t hypotension,  anakinra deferred     - 1/23: continue NSAID regimen; will ask PT to review her  Acuteenterocolitis     - Bld Cx NTD     - stool pending     - remains on rocephin and flagyl     - 1/23: still awaiting stool sample for PCR, continue rocephin/flagyl for now  COVID-19 infection     - Pt is unvaccinated     - sats mid-90's on RA     - remdes, solumedrol, IS, FV, albuterol     - 1/23: continue as above  Hypokalemia hypocalcemia Hypophosphatemia     - replace, follow     - 1/23: add phos, follow  Thrombocytopenia     - likely from viral illness     - no evidence of bleed     - 1/23: she is stable, follow  DVT prophylaxis: SCDs Code Status: FULL Family Communication: None at bedside.   Status is: Inpatient  Remains inpatient appropriate because:Inpatient level of care appropriate due to severity of illness   Dispo: The patient is from: Home              Anticipated d/c is to: Home              Anticipated d/c date is: 3 days              Patient currently is not medically stable to d/c.   Difficult to place patient No  Consultants:   Cardiology  Procedures:   LHC 01/25/20  Antimicrobials:  . Remdes, flagyl, rocephin   Subjective: "I just feel so tired."  Objective: Vitals:  01/26/20 2300 01/26/20 2326 01/27/20 0335 01/27/20 0800  BP:  93/61 (!) 87/70 (!) 85/61  Pulse: 78 76  75  Resp: 18 20 18  (!) 21  Temp:  98.7 F (37.1 C) 98.5 F (36.9 C) 98.5 F (36.9 C)  TempSrc:  Oral Oral Oral  SpO2: 95% 95%  92%  Weight:      Height:        Intake/Output Summary (Last 24 hours) at 01/27/2020 0859 Last data filed at 01/27/2020 01/29/2020 Gross per 24 hour  Intake 346 ml  Output --  Net 346 ml   Filed Weights   01/24/20 2319  Weight: 65.3 kg    Examination:  General: 30 y.o. female resting in bed in NAD Eyes: PERRL, normal sclera ENMT: Nares patent w/o discharge, orophaynx clear, dentition normal, ears w/o discharge/lesions/ulcers Neck: Supple, trachea  midline Cardiovascular: RRR, +S1, S2, no m/g/r, equal pulses throughout Respiratory: CTABL, no w/r/r, normal WOB GI: BS+, NDNT, no masses noted, no organomegaly noted MSK: No e/c/c Skin: No rashes, bruises, ulcerations noted Neuro: A&O x 3, no focal deficits Psyc: Appropriate interaction and affect, calm/cooperative   Data Reviewed: I have personally reviewed following labs and imaging studies.  CBC: Recent Labs  Lab 01/24/20 2320 01/25/20 1232 01/25/20 1548 01/26/20 0119 01/27/20 0111  WBC 13.2* 10.6*  --  8.9 13.1*  NEUTROABS 12.4* 9.7*  --  8.0* 11.2*  HGB 14.0 11.9* 10.9* 11.1* 11.5*  HCT 40.5 35.5* 32.0* 32.6* 33.0*  MCV 86.7 87.9  --  86.0 85.1  PLT 92* 84*  --  91* 92*   Basic Metabolic Panel: Recent Labs  Lab 01/24/20 2320 01/25/20 1232 01/25/20 1548 01/26/20 0119 01/27/20 0111  NA 132* 133* 135 131* 135  K 3.4* 3.2* 3.2* 3.7 3.7  CL 94* 98  --  99 104  CO2 25 24  --  22 19*  GLUCOSE 96 100*  --  93 120*  BUN 21* 13  --  11 22*  CREATININE 0.90 0.77  --  0.78 0.80  CALCIUM 7.9* 7.8*  --  7.4* 7.8*  MG  --  2.0  --  2.1 2.5*  PHOS  --   --   --  2.0* 1.8*   GFR: Estimated Creatinine Clearance: 99.1 mL/min (by C-G formula based on SCr of 0.8 mg/dL). Liver Function Tests: Recent Labs  Lab 01/24/20 2320 01/26/20 0119 01/27/20 0111  AST 42* 95* 32  ALT 34 36 32  ALKPHOS 89 92 99  BILITOT 1.2 1.1 0.5  PROT 6.9 5.1* 5.2*  ALBUMIN 3.0* 2.1* 2.1*   Recent Labs  Lab 01/24/20 2320  LIPASE 21   No results for input(s): AMMONIA in the last 168 hours. Coagulation Profile: No results for input(s): INR, PROTIME in the last 168 hours. Cardiac Enzymes: No results for input(s): CKTOTAL, CKMB, CKMBINDEX, TROPONINI in the last 168 hours. BNP (last 3 results) No results for input(s): PROBNP in the last 8760 hours. HbA1C: No results for input(s): HGBA1C in the last 72 hours. CBG: No results for input(s): GLUCAP in the last 168 hours. Lipid  Profile: Recent Labs    01/25/20 0354  TRIG 263*   Thyroid Function Tests: No results for input(s): TSH, T4TOTAL, FREET4, T3FREE, THYROIDAB in the last 72 hours. Anemia Panel: Recent Labs    01/26/20 0119 01/27/20 0111  FERRITIN 272 183   Sepsis Labs: Recent Labs  Lab 01/25/20 0354 01/25/20 1002  PROCALCITON 4.96  --   LATICACIDVEN 1.7 1.8  Recent Results (from the past 240 hour(s))  SARS Coronavirus 2 by RT PCR (hospital order, [facility]) Nasopharyngeal Nasopharyngeal Swab     Status: Abnormal   Collection Time: 01/24/20 11:20 PM   Specimen: Nasopharyngeal Swab  Result Value Ref Range Status   SARS Coronavirus 2 POSITIVE (A) NEGATIVE Final    Comment: RESULT CALLED TO, READ BACK BY AND VERIFIED WITH: KELLIE NEAL RN ON 01/25/20 AT 0102 [facility] (NOTE) SARS-CoV-2 target nucleic acids are DETECTED  SARS-CoV-2 RNA is generally detectable in upper respiratory specimens  during the acute phase of infection.  Positive results are indicative  of the presence of the identified virus, but do not rule out bacterial infection or co-infection with other pathogens not detected by the test.  Clinical correlation with patient history and  other diagnostic information is necessary to determine patient infection status.  The expected result is negative.  Fact Sheet for Patients:   BoilerBrush.com.cyhttps://www.fda.gov/media/136312/download   Fact Sheet for Healthcare Providers:   https://pope.com/https://www.fda.gov/media/136313/download    This test is not yet approved or cleared by the [place] FDA and  has been authorized for detection and/or diagnosis of SARS-CoV-2 by FDA under an Emergency Use Authorization (EUA).  This EUA will remain in effect (meaning this  test can be used) for the duration of  the COVID-19 declaration under Section 564(b)(1) of the Act, 21 U.S.C. section 360-bbb-3(b)(1), unless the authorization is terminated or revoked sooner.  Performed at [facility]  [place], [address] [place], [place] [address]   Blood Culture (routine x 2)     Status: None (Preliminary result)   Collection Time: 01/25/20  3:54 AM   Specimen: BLOOD  Result Value Ref Range Status   Specimen Description BLOOD LEFT ANTECUBITAL  Final   Special Requests   Final    BOTTLES DRAWN AEROBIC AND ANAEROBIC Blood Culture results may not be optimal due to an excessive volume of blood received in culture bottles   Culture   Final    NO GROWTH 1 DAY Performed at [facility], [address] [place], [place] [address]    Report Status PENDING  Incomplete  Blood Culture (routine x 2)     Status: None (Preliminary result)   Collection Time: 01/25/20  3:54 AM   Specimen: BLOOD LEFT HAND  Result Value Ref Range Status   Specimen Description BLOOD LEFT HAND  Final   Special Requests   Final    BOTTLES DRAWN AEROBIC AND ANAEROBIC Blood Culture adequate volume   Culture   Final    NO GROWTH 1 DAY Performed at [facility], [address] [place], [place] [address]    Report Status PENDING  Incomplete  MRSA PCR Screening     Status: Abnormal   Collection Time: 01/26/20  2:35 PM   Specimen: Nasal Mucosa; Nasopharyngeal  Result Value Ref Range Status   MRSA by PCR POSITIVE (A) NEGATIVE Final    Comment:        The GeneXpert MRSA Assay (FDA approved for NASAL specimens only), is one component of a comprehensive MRSA colonization surveillance program. It is not intended to diagnose MRSA infection nor to guide or monitor treatment for MRSA infections. RESULT CALLED TO, READ BACK BY AND VERIFIED WITH: RN CHARGE NURSE 01/26/20 AT 1635 DW Performed at [facility], [address] [place], [place] [address]       Radiology Studies: CT ANGIO CHEST PE W  OR WO CONTRAST  Result Date: 01/25/2020 CLINICAL DATA:  COVID-19 infection, shortness of breath, cough, fever and vomiting. Elevated D-dimer and elevated troponin I levels. EXAM: CT ANGIOGRAPHY  CHEST WITH CONTRAST TECHNIQUE: Multidetector CT imaging of the chest was performed using the standard protocol during bolus administration of intravenous contrast. Multiplanar CT image reconstructions and MIPs were obtained to evaluate the vascular anatomy. CONTRAST:  OMNIPAQUE IOHEXOL 350 MG/ML SOLN COMPARISON:  Chest x-ray earlier today. FINDINGS: Cardiovascular: Pulmonary arteries are well opacified and of normal caliber. No evidence of pulmonary embolism. The thoracic aorta is normal in caliber. The heart size is top normal. Suggestion possibly mild dilatation of the left ventricle by CT. No pericardial fluid identified. No visible calcified coronary artery plaque. Mediastinum/Nodes: No enlarged mediastinal, hilar, or axillary lymph nodes. Thyroid gland, trachea, and esophagus demonstrate no significant findings. Lungs/Pleura: Trace pleural fluid in the inferior right hemithorax. Atelectasis and scarring along the inferior aspect of the left major fissure extending into the inferior lingula. Mild atelectasis versus subtle infiltrate at the posterior right lung base. Otherwise no significant airspace disease or widespread evidence of COVID pneumonia. Tiny nonspecific 3 mm nodule in the right middle lobe is likely benign. No overt pulmonary edema or pneumothorax. Upper Abdomen: No acute abnormality. Musculoskeletal: No bony abnormalities. The body wall soft tissues of the chest and visualized upper abdomen demonstrate suggestion edema in the body wall fat suggestive of anasarca. Review of the MIP images confirms the above findings. IMPRESSION: 1. No evidence of pulmonary embolism. 2. Mild atelectasis versus subtle infiltrate at the posterior right lung base. Otherwise no significant airspace disease or evidence of significant COVID pneumonia. 3. Trace pleural fluid in the inferior right hemithorax. 4. Suggestion of possibly mild dilatation of the left ventricle by CT. 5. Suggestion of edema in the body wall  fat suggestive of anasarca. 6. Small nonspecific 3 mm nodule in the right middle lobe is likely benign/postinflammatory. Electronically Signed   By: Irish Lack M.D.   On: 01/25/2020 13:28   CARDIAC CATHETERIZATION  Result Date: 01/25/2020 1. No angiographic evidence of CAD 2. Normal filling pressures 3. Elevated troponin likely due demand ischemia from viral syndrome/hypotension/dehydration. Recommendations: No further ischemic evaluation.   ECHOCARDIOGRAM LIMITED  Result Date: 01/25/2020    ECHOCARDIOGRAM LIMITED REPORT   Patient Name:   ELENA DAVIA Date of Exam: 01/25/2020 Medical Rec #:  [id number]     Height:       67.0 in Accession #:    [id number]    Weight:       144.0 lb Date of Birth:  08-22-1990     BSA:          1.759 m Patient Age:    29 years      BP:           85/56 mmHg Patient Gender: F             HR:           102 bpm. Exam Location:  Inpatient Procedure: Cardiac Doppler, Limited Echo and Limited Color Doppler                                MODIFIED REPORT:     This report was modified by Chilton Si MD on 01/25/2020 due to RV  hypokinesis.  Indications:     Elevated Troponin  History:         Patient has no prior history of Echocardiogram examinations.  Sonographer:     Eulah Pont RDCS Referring Phys:  Judye Bos DUNN Diagnosing Phys: Chilton Si MD IMPRESSIONS  1. Posterolateral hypokinesis. Hypokinesis of the basal to mid regions with apical sparing. 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 Left ventricular ejection fraction, by estimation, is 40 to 45%. The left ventricle has mildly decreased function. The left ventricle demonstrates regional wall motion abnormalities (see scoring diagram/findings for description). Left ventricular diastolic parameters are consistent with Grade II diastolic dysfunction (pseudonormalization).  2. Hypokinesis of the apical RV. Right ventricular systolic function is mildly reduced. There is  normal pulmonary artery systolic pressure.  3. A small pericardial effusion is present.  4. Mild mitral valve regurgitation.  5. The aortic valve is tricuspid.  6. The inferior vena cava is normal in size with <50% respiratory variability, suggesting right atrial pressure of 8 mmHg. FINDINGS  Left Ventricle: Posterolateral hypokinesis. Hypokinesis of the basal to mid regions with apical sparing. 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 Left ventricular ejection fraction, by estimation, is 40 to 45%. The left ventricle has mildly  decreased function. The left ventricle demonstrates regional wall motion abnormalities. Left ventricular diastolic parameters are consistent with Grade II diastolic dysfunction (pseudonormalization). Right Ventricle: Hypokinesis of the apical RV. Right ventricular systolic function is mildly reduced. There is normal pulmonary artery systolic pressure. The tricuspid regurgitant velocity is 1.83 m/s, and with an assumed right atrial pressure of 8 mmHg,  the estimated right ventricular systolic pressure is 21.4 mmHg. Pericardium: A small pericardial effusion is present. Mitral Valve: Mild mitral valve regurgitation. Tricuspid Valve: Tricuspid valve regurgitation is mild. Aortic Valve: The aortic valve is tricuspid. Pulmonic Valve: Pulmonic valve regurgitation is trivial. Venous: The inferior vena cava is normal in size with less than 50% respiratory variability, suggesting right atrial pressure of 8 mmHg. LEFT VENTRICLE PLAX 2D LVIDd:         4.20 cm     Diastology LVIDs:         3.10 cm     LV e' medial:    9.37 cm/s LV PW:         0.70 cm     LV E/e' medial:  9.8 LV IVS:        0.60 cm     LV e' lateral:   8.67 cm/s LVOT diam:     1.90 cm     LV E/e' lateral: 10.6 LV SV:         46 LV SV Index:   26 LVOT Area:     2.84 cm  LV Volumes (MOD) LV vol d, MOD A2C: 78.1 ml LV vol d, MOD A4C: 74.4 ml LV vol s, MOD A2C: 39.9 ml LV vol s, MOD A4C: 39.1 ml LV SV MOD A2C:     38.2 ml LV SV  MOD A4C:     74.4 ml LV SV MOD BP:      37.1 ml RIGHT VENTRICLE RV S prime:     10.70 cm/s TAPSE (M-mode): 1.8 cm LEFT ATRIUM         Index LA diam:    2.70 cm 1.54 cm/m  AORTIC VALVE LVOT Vmax:   102.00 cm/s LVOT Vmean:  82.600 cm/s LVOT VTI:    0.162 m  AORTA Ao Root diam: 2.40 cm MITRAL VALVE  TRICUSPID VALVE MV Area (PHT): 5.42 cm    TR Peak grad:   13.4 mmHg MV Decel Time: 140 msec    TR Vmax:        183.00 cm/s MV E velocity: 92.10 cm/s MV A velocity: 52.30 cm/s  SHUNTS MV E/A ratio:  1.76        Systemic VTI:  0.16 m                            Systemic Diam: 1.90 cm Chilton Si MD Electronically signed by Chilton Si MD Signature Date/Time: 01/25/2020/12:14:57 PM    Final (Updated)      Scheduled Meds: . albuterol  2 puff Inhalation Q6H  . vitamin C  500 mg Oral Daily  . colchicine  0.6 mg Oral Daily  . famotidine  20 mg Oral BID  . ibuprofen  600 mg Oral TID WC  . methylPREDNISolone (SOLU-MEDROL) injection  0.5 mg/kg Intravenous Q12H  . mupirocin ointment   Nasal BID  . sodium chloride flush  3 mL Intravenous Q12H  . zinc sulfate  220 mg Oral Daily   Continuous Infusions: . sodium chloride    . cefTRIAXone (ROCEPHIN)  IV 2 g (01/26/20 1117)  . lactated ringers Stopped (01/25/20 0510)  . lactated ringers 200 mL/hr at 01/25/20 0735  . metronidazole 500 mg (01/27/20 0338)  . remdesivir 100 mg in NS 100 mL 100 mg (01/26/20 1041)  . sodium chloride       LOS: 2 days    Time spent: 40 minutes spent in the coordination of care today.    Teddy Spike, DO [facility]  If 7PM-7AM, please contact night-coverage [email or web address] 01/27/2020, 8:59 AM

## 2020-01-27 NOTE — Progress Notes (Signed)
Hemodynamics remained stable  Reconsult critical care if needed Will sign off

## 2020-01-27 NOTE — Plan of Care (Signed)

## 2020-01-28 ENCOUNTER — Encounter (HOSPITAL_COMMUNITY): Payer: Self-pay | Admitting: Cardiovascular Disease

## 2020-01-28 DIAGNOSIS — U071 COVID-19: Secondary | ICD-10-CM | POA: Diagnosis not present

## 2020-01-28 LAB — GASTROINTESTINAL PANEL BY PCR, STOOL (REPLACES STOOL CULTURE)

## 2020-01-28 LAB — COMPREHENSIVE METABOLIC PANEL
ALT: 32 U/L (ref 0–44)
AST: 25 U/L (ref 15–41)
Albumin: 2 g/dL — ABNORMAL LOW (ref 3.5–5.0)
Alkaline Phosphatase: 108 U/L (ref 38–126)
Anion gap: 7 (ref 5–15)
BUN: 21 mg/dL — ABNORMAL HIGH (ref 6–20)
CO2: 24 mmol/L (ref 22–32)
Calcium: 8 mg/dL — ABNORMAL LOW (ref 8.9–10.3)
Chloride: 106 mmol/L (ref 98–111)
Creatinine, Ser: 0.79 mg/dL (ref 0.44–1.00)
GFR, Estimated: >60 mL/min (ref >60)
Glucose, Bld: 157 mg/dL — ABNORMAL HIGH (ref 70–99)
Potassium: 4.1 mmol/L (ref 3.5–5.1)
Sodium: 137 mmol/L (ref 135–145)
Total Bilirubin: 0.5 mg/dL (ref 0.3–1.2)
Total Protein: 5.1 g/dL — ABNORMAL LOW (ref 6.5–8.1)

## 2020-01-28 LAB — CBC WITH DIFFERENTIAL/PLATELET
Abs Immature Granulocytes: 0 10*3/uL (ref 0.00–0.07)
Basophils Absolute: 0 10*3/uL (ref 0.0–0.1)
Basophils Relative: 0 %
Eosinophils Absolute: 0 10*3/uL (ref 0.0–0.5)
Eosinophils Relative: 0 %
HCT: 32.5 % — ABNORMAL LOW (ref 36.0–46.0)
Hemoglobin: 11.5 g/dL — ABNORMAL LOW (ref 12.0–15.0)
Lymphocytes Relative: 4 %
Lymphs Abs: 0.6 10*3/uL — ABNORMAL LOW (ref 0.7–4.0)
MCH: 30.3 pg (ref 26.0–34.0)
MCHC: 35.4 g/dL (ref 30.0–36.0)
MCV: 85.5 fL (ref 80.0–100.0)
Monocytes Absolute: 0.3 10*3/uL (ref 0.1–1.0)
Monocytes Relative: 2 %
Neutro Abs: 14.8 10*3/uL — ABNORMAL HIGH (ref 1.7–7.7)
Neutrophils Relative %: 94 %
Platelets: 173 10*3/uL (ref 150–400)
RBC: 3.8 MIL/uL — ABNORMAL LOW (ref 3.87–5.11)
RDW: 14.9 % (ref 11.5–15.5)
WBC: 15.7 10*3/uL — ABNORMAL HIGH (ref 4.0–10.5)
nRBC: 0 % (ref 0.0–0.2)
nRBC: 0 /100 WBC

## 2020-01-28 LAB — D-DIMER, QUANTITATIVE: D-Dimer, Quant: 1.98 ug/mL-FEU — ABNORMAL HIGH (ref 0.00–0.50)

## 2020-01-28 LAB — FERRITIN: Ferritin: 112 ng/mL (ref 11–307)

## 2020-01-28 LAB — C-REACTIVE PROTEIN: CRP: 8.2 mg/dL — ABNORMAL HIGH (ref <1.0)

## 2020-01-28 LAB — PHOSPHORUS: Phosphorus: 1.5 mg/dL — ABNORMAL LOW (ref 2.5–4.6)

## 2020-01-28 LAB — MAGNESIUM: Magnesium: 2.3 mg/dL (ref 1.7–2.4)

## 2020-01-28 MED ORDER — K PHOS MONO-SOD PHOS DI & MONO 155-852-130 MG PO TABS
250.0000 mg | ORAL_TABLET | Freq: Three times a day (TID) | ORAL | Status: AC
  Administered 2020-01-28 (×3): 250 mg via ORAL
  Filled 2020-01-28 (×3): qty 1

## 2020-01-28 MED FILL — Clopidogrel Bisulfate Tab 300 MG (Base Equiv): ORAL | Qty: 2 | Status: AC

## 2020-01-28 MED FILL — Verapamil HCl IV Soln 2.5 MG/ML: INTRAVENOUS | Qty: 2 | Status: AC

## 2020-01-28 MED FILL — Nitroglycerin IV Soln 100 MCG/ML in D5W: INTRA_ARTERIAL | Qty: 10 | Status: AC

## 2020-01-28 NOTE — TOC Benefit Eligibility Note (Signed)
Transition of Care [facility]) Benefit Eligibility Note    Patient Details  Name: Sofya Moustafa MRN: [id number] Date of Birth: 1990/03/08   Medication/Dose: COLCHICINE 0.6 MG BID  Covered?: Yes        Spoke with Person/Company/Phone Number:: JEFF SMITH #  [phone]  CALLED  [employer]     Prior Approval: Yes [phone])          Mardene Sayer Phone Number: 01/28/2020, 11:04 AM

## 2020-01-28 NOTE — Evaluation (Signed)
Physical Therapy Evaluation Patient Details Name: Katrina [name] MRN: [id number] DOB: 19-Jun-1990 Today's Date: 01/28/2020   History of Present Illness  Pt is a 29yo female who presenting to hosptial with body aches, fever, SOB, and diarrhea. Found to COVID+. Also had chest pain, when for a R/L heart cath and coronoary angiography on 1/21 and transferred to from 5w to 2c.    Clinical Impression  Pt presenting with report of severe deconditioning and bilat LE 7/10 pain. Pt inconsistent with MMT and functional assessment. Pt asking if she needs a walker to go home with and if she stays here to get stronger. When asked if we need to look for SNF pt adamantly stated "oh no no no". Instructed pt she will need help to care for her twin 21yos and 87 month old despite her stating she wants them near her. Pt also worried about stairs however I feel pt will progress well enough to be able to amb complete necessary stair negotiation. Pt with decreased in BP and with report of dizziness with standing/ambulation. Encouraged pt and RN staff to assist pt to/from bathroom instead of using BSC to improve ambulation ability and activity tolerance. Acute PT to follow.    Follow Up Recommendations Home health PT;Supervision/Assistance - 24 hour (per pt request to improve strength, anticipate pt with progress and not need HHPT)    Equipment Recommendations  None recommended by PT (pt requesting walker, anticipating pt to progress well)    Recommendations for Other Services       Precautions / Restrictions Precautions Precautions: Fall Precaution Comments: watch BP Restrictions Weight Bearing Restrictions: No      Mobility  Bed Mobility Overal bed mobility: Modified Independent             General bed mobility comments: HOB elevated, increased time, pt moving LE slowly    Transfers Overall transfer level: Needs assistance Equipment used: None Transfers: Sit to/from Stand Sit to Stand: Min guard          General transfer comment: verbal cues for hand placement, incresaed time, slow and cautious, verbal cues to increase base of support  Ambulation/Gait Ambulation/Gait assistance: Min assist Gait Distance (Feet): 12 Feet Assistive device: IV Pole Gait Pattern/deviations: Step-through pattern;Decreased stride length;Shuffle Gait velocity: slow Gait velocity interpretation: <1.31 ft/sec, indicative of household ambulator General Gait Details: pt extremely slow with short, shuffled steps and narrow base of support. When attempted increase cadance pt would stop and [name] "I'm going to fast, I'm going to pass out."  Stairs            Wheelchair Mobility    Modified Rankin (Stroke Patients Only)       Balance Overall balance assessment: Needs assistance Sitting-balance support: Feet supported;No upper extremity supported Sitting balance-Leahy Scale: Good     Standing balance support: Single extremity supported Standing balance-Leahy Scale: Poor Standing balance comment: pt relying on IV pole to maintain balance                             Pertinent Vitals/Pain Pain Assessment: 0-10 Pain Score: 7  Pain Location: bilat LEs Pain Descriptors / Indicators: Dull;Aching;Tingling Pain Intervention(s): Monitored during session    Home Living Family/patient expects to be discharged to:: Private residence Living Arrangements: Spouse/significant other;Children (fiance, 18 mo/ twin 5yo) Available Help at Discharge: Family;Available PRN/intermittently Type of Home: House Home Access: Stairs to enter Entrance Stairs-Rails: None Entrance Stairs-Number of Steps: 3 Home  Layout: Multi-level (4 floors) Home Equipment: None Additional Comments: pt reports she is going to quarentine in the man cave downstairs, she can access it from outside and only do 2 steps instead of a flight    Prior Function Level of Independence: Independent         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     Hand Dominance   Dominant Hand: Right    Extremity/Trunk Assessment   Upper Extremity Assessment Upper Extremity Assessment: Overall WFL for tasks assessed    Lower Extremity Assessment Lower Extremity Assessment: Generalized weakness (pt MMT tests [facility] but functions with bilat LE weakness)    Cervical / Trunk Assessment Cervical / Trunk Assessment: Normal  Communication   Communication: No difficulties  Cognition Arousal/Alertness: Awake/alert Behavior During Therapy: WFL for tasks assessed/performed Overall Cognitive Status: Within Functional Limits for tasks assessed                                 General Comments: pt appears self limiting, keeps asking if she needs a walker and if she can stay here to get stronger      General Comments General comments (skin integrity, edema, etc.): BP dropped to 90/72 in standing/walking, SpO2 >95% on RA    Exercises General Exercises - Lower Extremity Ankle Circles/Pumps: AROM;Both;10 reps;Seated Long Arc Quad: AROM;Both;10 reps;Seated (with 5 sec at top) Hip Flexion/Marching: AROM;Both;10 reps;Seated   Assessment/Plan    PT Assessment Patient needs continued PT services  PT Problem List Decreased strength;Decreased activity tolerance;Decreased balance;Decreased mobility       PT Treatment Interventions DME instruction;Gait training;Stair training;Functional mobility training;Therapeutic activities;Therapeutic exercise;Balance training    PT Goals (Current goals can be found in the Care Plan section)  Acute Rehab PT Goals Patient Stated Goal: home PT Goal Formulation: With patient Time For Goal Achievement: 02/11/20 Potential to Achieve Goals: Good    Frequency Min 3X/week   Barriers to discharge Decreased caregiver support (spouse is truck driver) pt concerned about stairs and caring for her children    Co-evaluation               AM-PAC PT "6 Clicks" Mobility  Outcome  Measure Help needed turning from your back to your side while in a flat bed without using bedrails?: None Help needed moving from lying on your back to sitting on the side of a flat bed without using bedrails?: None Help needed moving to and from a bed to a chair (including a wheelchair)?: A Little Help needed standing up from a chair using your arms (e.g., wheelchair or bedside chair)?: A Little Help needed to walk in hospital room?: A Little Help needed climbing 3-5 steps with a railing? : A Little 6 Click Score: 20    End of Session Equipment Utilized During Treatment: Gait belt Activity Tolerance: Patient tolerated treatment well Patient left: in chair;with call bell/phone within reach Nurse Communication: Mobility status PT Visit Diagnosis: Unsteadiness on feet (R26.81);Muscle weakness (generalized) (M62.81);Difficulty in walking, not elsewhere classified (R26.2)    Time: 4174-0814 PT Time Calculation (min) (ACUTE ONLY): 44 min   Charges:   PT Evaluation $PT Eval Moderate Complexity: 1 Mod PT Treatments $Gait Training: 8-22 mins $Therapeutic Exercise: 8-22 mins        Lewis Shock, PT, DPT Acute Rehabilitation Services Pager #: [phone] Office #: [phone]   Iona Hansen 01/28/2020, 2:05 PM

## 2020-01-28 NOTE — Progress Notes (Signed)
PROGRESS NOTE    Katrina [name]  TDD:[id number] DOB: 1990/01/13 DOA: 01/24/2020 PCP: Patient, No Pcp Per   Brief Narrative:   Katrina Hamptonis a 30 y.o.femalewith medical history significant ofmigraine headaches and chronic low back pain who presents with complaints of 6 days offever and diarrhea.She isnot immunized against COVID-19. Complains of associated symptoms of body aches, intermittent nonproductive cough, chest discomfort, shortness of breath, generalized crampy abdominal pain, and generalized weakness. Denies any blood in emesis or diarrhea. She has not been recently on antibiotics or steroids. Denies any tobacco, drug use, or daily drinking of alcohol. Family history is significant for a brother in his 71s who has heart failure, mother suffers from tachycardia, and dad who died of a heart attack in his 38s. Shereports previously being told that she had a heart murmur, but had not had any further work-up done in the past.Upon admission into the emergency department patient was seen to have fever up to 100.5 F, pulse 10 3-116, respirations 16-33, blood pressure 79/51-92/65 despite receiving4liters of IV fluids, and O2 saturationsmaintained on room air. COVID-19 screening was positive. Chest x-ray was clear. CT scan of the abdomen pelvis significant for inflammatory or infectious enterocolitis.EKG abnormal, cardiology consulted, given elevated troponin patient was taken for cardiac catheterization which thankfully was unremarkable.  01/28/20: Continue ibuprofen/colchicine. PT to see. Add phos. Follow.   Assessment & Plan: Acute Myopericarditis Elevated troponin/NSTEMI     - appreciate cards assistance     - s/p cardiac cath; unrremarkable     - started on ibuprofen 600mg  TID for 2 - 4 weeks; colchicine 0.6mg  daily for 3 months     - cards rec limiting physical activity to walking and light wt training (<5kg) for at least 3 months     - BB deffered d/t hypotension,  anakinra deferred     - 1/24: continue current regimen  Acuteenterocolitis     - Bld Cx NTD     - stool pending     - remains on rocephin and flagyl     - 1/24: GI PCR is negative, but she had 2 days of abx before sample given; treat for 5 days total  COVID-19 infection     - Pt is unvaccinated     - remdes, solumedrol, IS, FV, albuterol     - 1/24: Sats remain good on RA, continue current regimen  Hypokalemia hypocalcemia Hypophosphatemia     - replace, follow     - 1/24: phos remains low, oral K-phos added  Thrombocytopenia     - likely from viral illness     - no evidence of bleed     - 1/24: thrombocytopenia now resolved  Generalized weakness     - PT to see  DVT prophylaxis: SCDs Code Status: FULL Family Communication: spoke with husband by phone   Status is: Inpatient  Remains inpatient appropriate because:Inpatient level of care appropriate due to severity of illness   Dispo: The patient is from: Home              Anticipated d/c is to: Home              Anticipated d/c date is: 2 days              Patient currently is not medically stable to d/c.   Difficult to place patient No  Consultants:   Cardiology  PCCM  Procedures:   [facility] 01/25/20  Antimicrobials:  . Remdes . Flagyl . Rocephin  Subjective: "I still feel weak"  Objective: Vitals:   01/27/20 2300 01/28/20 0027 01/28/20 0300 01/28/20 0400  BP: (!) 84/54 (!) 86/64 (!) 83/59 (!) 88/62  Pulse: 70 83 63 68  Resp: 19 16 17 16   Temp:  97.9 F (36.6 C)  98.4 F (36.9 C)  TempSrc:  Oral  Oral  SpO2: 96% 94% 94% 95%  Weight:      Height:        Intake/Output Summary (Last 24 hours) at 01/28/2020 0701 Last data filed at 01/28/2020 01/30/2020 Gross per 24 hour  Intake 2543 ml  Output 375 ml  Net 2168 ml   Filed Weights   01/24/20 2319  Weight: 65.3 kg    Examination:  General: 30 y.o. female resting in bed in NAD Eyes: PERRL, normal sclera ENMT: Nares patent w/o discharge,  orophaynx clear, dentition normal, ears w/o discharge/lesions/ulcers Neck: Supple, trachea midline Cardiovascular: RRR, +S1, S2, no m/g/r, equal pulses throughout Respiratory: CTABL, no w/r/r, normal WOB GI: BS+, NDNT, no masses noted, no organomegaly noted MSK: No e/c/c Skin: No rashes, bruises, ulcerations noted Neuro: A&O x 3, no focal deficits Psyc: Appropriate interaction and affect, calm/cooperative   Data Reviewed: I have personally reviewed following labs and imaging studies.  CBC: Recent Labs  Lab 01/24/20 2320 01/25/20 1232 01/25/20 1548 01/26/20 0119 01/27/20 0111 01/28/20 0441  WBC 13.2* 10.6*  --  8.9 13.1* 15.7*  NEUTROABS 12.4* 9.7*  --  8.0* 11.2* PENDING  HGB 14.0 11.9* 10.9* 11.1* 11.5* 11.5*  HCT 40.5 35.5* 32.0* 32.6* 33.0* 32.5*  MCV 86.7 87.9  --  86.0 85.1 85.5  PLT 92* 84*  --  91* 92* 173   Basic Metabolic Panel: Recent Labs  Lab 01/24/20 2320 01/25/20 1232 01/25/20 1548 01/26/20 0119 01/27/20 0111 01/28/20 0441  NA 132* 133* 135 131* 135 137  K 3.4* 3.2* 3.2* 3.7 3.7 4.1  CL 94* 98  --  99 104 106  CO2 25 24  --  22 19* 24  GLUCOSE 96 100*  --  93 120* 157*  BUN 21* 13  --  11 22* 21*  CREATININE 0.90 0.77  --  0.78 0.80 0.79  CALCIUM 7.9* 7.8*  --  7.4* 7.8* 8.0*  MG  --  2.0  --  2.1 2.5* 2.3  PHOS  --   --   --  2.0* 1.8* 1.5*   GFR: Estimated Creatinine Clearance: 99.1 mL/min (by C-G formula based on SCr of 0.79 mg/dL). Liver Function Tests: Recent Labs  Lab 01/24/20 2320 01/26/20 0119 01/27/20 0111 01/28/20 0441  AST 42* 95* 32 25  ALT 34 36 32 32  ALKPHOS 89 92 99 108  BILITOT 1.2 1.1 0.5 0.5  PROT 6.9 5.1* 5.2* 5.1*  ALBUMIN 3.0* 2.1* 2.1* 2.0*   Recent Labs  Lab 01/24/20 2320  LIPASE 21   No results for input(s): AMMONIA in the last 168 hours. Coagulation Profile: No results for input(s): INR, PROTIME in the last 168 hours. Cardiac Enzymes: No results for input(s): CKTOTAL, CKMB, CKMBINDEX, TROPONINI in the  last 168 hours. BNP (last 3 results) No results for input(s): PROBNP in the last 8760 hours. HbA1C: No results for input(s): HGBA1C in the last 72 hours. CBG: No results for input(s): GLUCAP in the last 168 hours. Lipid Profile: No results for input(s): CHOL, HDL, LDLCALC, TRIG, CHOLHDL, LDLDIRECT in the last 72 hours. Thyroid Function Tests: No results for input(s): TSH, T4TOTAL, FREET4, T3FREE, THYROIDAB in the last  72 hours. Anemia Panel: Recent Labs    01/27/20 0111 01/28/20 0441  FERRITIN 183 112   Sepsis Labs: Recent Labs  Lab 01/25/20 0354 01/25/20 1002  PROCALCITON 4.96  --   LATICACIDVEN 1.7 1.8    Recent Results (from the past 240 hour(s))  SARS Coronavirus 2 by RT PCR (hospital order, performed in [facility] hospital lab) Nasopharyngeal Nasopharyngeal Swab     Status: Abnormal   Collection Time: 01/24/20 11:20 PM   Specimen: Nasopharyngeal Swab  Result Value Ref Range Status   SARS Coronavirus 2 POSITIVE (A) NEGATIVE Final    Comment: RESULT CALLED TO, READ BACK BY AND VERIFIED WITH: KELLIE NEAL RN ON 01/25/20 AT 0102 [facility] (NOTE) SARS-CoV-2 target nucleic acids are DETECTED  SARS-CoV-2 RNA is generally detectable in upper respiratory specimens  during the acute phase of infection.  Positive results are indicative  of the presence of the identified virus, but do not rule out bacterial infection or co-infection with other pathogens not detected by the test.  Clinical correlation with patient history and  other diagnostic information is necessary to determine patient infection status.  The expected result is negative.  Fact Sheet for Patients:   BoilerBrush.com.cy   Fact Sheet for Healthcare Providers:   https://pope.com/    This test is not yet approved or cleared by the [place] FDA and  has been authorized for detection and/or diagnosis of SARS-CoV-2 by FDA under an Emergency Use Authorization (EUA).   This EUA will remain in effect (meaning this  test can be used) for the duration of  the COVID-19 declaration under Section 564(b)(1) of the Act, 21 U.S.C. section 360-bbb-3(b)(1), unless the authorization is terminated or revoked sooner.  Performed at [facility], [address] [place], [place] [address]   Blood Culture (routine x 2)     Status: None (Preliminary result)   Collection Time: 01/25/20  3:54 AM   Specimen: BLOOD  Result Value Ref Range Status   Specimen Description BLOOD LEFT ANTECUBITAL  Final   Special Requests   Final    BOTTLES DRAWN AEROBIC AND ANAEROBIC Blood Culture results may not be optimal due to an excessive volume of blood received in culture bottles   Culture   Final    NO GROWTH 2 DAYS Performed at [facility], [address] [place], [place] [address]    Report Status PENDING  Incomplete  Blood Culture (routine x 2)     Status: None (Preliminary result)   Collection Time: 01/25/20  3:54 AM   Specimen: BLOOD LEFT HAND  Result Value Ref Range Status   Specimen Description BLOOD LEFT HAND  Final   Special Requests   Final    BOTTLES DRAWN AEROBIC AND ANAEROBIC Blood Culture adequate volume   Culture   Final    NO GROWTH 2 DAYS Performed at [facility], [address], [place], [place] [address]    Report Status PENDING  Incomplete  MRSA PCR Screening     Status: Abnormal   Collection Time: 01/26/20  2:35 PM   Specimen: Nasal Mucosa; Nasopharyngeal  Result Value Ref Range Status   MRSA by PCR POSITIVE (A) NEGATIVE Final    Comment:        The GeneXpert MRSA Assay (FDA approved for NASAL specimens only), is one component of a comprehensive MRSA colonization surveillance program. It is not intended to diagnose MRSA infection nor to guide or monitor treatment for MRSA infections.  RESULT CALLED TO, READ BACK BY AND VERIFIED WITH: RN CHARGE NURSE 01/26/20 AT 1635 DW Performed at [facility], [address]  [place], [place] [address]       Radiology Studies: No results found.   Scheduled Meds: . albuterol  2 puff Inhalation Q6H  . vitamin C  500 mg Oral Daily  . colchicine  0.6 mg Oral Daily  . famotidine  20 mg Oral BID  . ibuprofen  600 mg Oral TID WC  . methylPREDNISolone (SOLU-MEDROL) injection  0.5 mg/kg Intravenous Q12H  . mupirocin ointment   Nasal BID  . sodium chloride flush  3 mL Intravenous Q12H  . zinc sulfate  220 mg Oral Daily   Continuous Infusions: . sodium chloride    . cefTRIAXone (ROCEPHIN)  IV 2 g (01/27/20 1044)  . lactated ringers Stopped (01/25/20 0510)  . lactated ringers 200 mL/hr at 01/25/20 0735  . metronidazole 500 mg (01/28/20 0331)  . remdesivir 100 mg in NS 100 mL 100 mg (01/27/20 0949)  . sodium chloride       LOS: 3 days    Time spent: 25 minutes spent in the coordination of care today.   Teddy Spikeyrone A Neah Sporrer, DO [facility]  If 7PM-7AM, please contact night-coverage [email or web address] 01/28/2020, 7:01 AM

## 2020-01-28 NOTE — Plan of Care (Signed)

## 2020-01-28 NOTE — TOC Progression Note (Signed)
Transition of Care [facility]) - Progression Note    Patient Details  Name: Katrina [name] MRN: [id number] Date of Birth: September 16, 1990  Transition of Care [facility] CM/SW Contact  Leone Haven, RN Phone Number: 01/28/2020, 6:30 PM  Clinical Narrative:     NSTEMI, entercolitis, COVID , conts on remdisivir. TOC will  cont to follow.        Expected Discharge Plan and Services                                                 Social Determinants of Health (SDOH) Interventions    Readmission Risk Interventions No flowsheet data found.

## 2020-01-29 DIAGNOSIS — U071 COVID-19: Secondary | ICD-10-CM | POA: Diagnosis not present

## 2020-01-29 LAB — COMPREHENSIVE METABOLIC PANEL
ALT: 34 U/L (ref 0–44)
AST: 33 U/L (ref 15–41)
Albumin: 1.9 g/dL — ABNORMAL LOW (ref 3.5–5.0)
Alkaline Phosphatase: 92 U/L (ref 38–126)
Anion gap: 8 (ref 5–15)
BUN: 22 mg/dL — ABNORMAL HIGH (ref 6–20)
CO2: 24 mmol/L (ref 22–32)
Calcium: 7.7 mg/dL — ABNORMAL LOW (ref 8.9–10.3)
Chloride: 102 mmol/L (ref 98–111)
Creatinine, Ser: 0.69 mg/dL (ref 0.44–1.00)
GFR, Estimated: >60 mL/min (ref >60)
Glucose, Bld: 149 mg/dL — ABNORMAL HIGH (ref 70–99)
Potassium: 4.2 mmol/L (ref 3.5–5.1)
Sodium: 134 mmol/L — ABNORMAL LOW (ref 135–145)
Total Bilirubin: 0.7 mg/dL (ref 0.3–1.2)
Total Protein: 4.8 g/dL — ABNORMAL LOW (ref 6.5–8.1)

## 2020-01-29 LAB — CBC WITH DIFFERENTIAL/PLATELET
Abs Immature Granulocytes: 1.26 10*3/uL — ABNORMAL HIGH (ref 0.00–0.07)
Basophils Absolute: 0.1 10*3/uL (ref 0.0–0.1)
Basophils Relative: 1 %
Eosinophils Absolute: 0 10*3/uL (ref 0.0–0.5)
Eosinophils Relative: 0 %
HCT: 31 % — ABNORMAL LOW (ref 36.0–46.0)
Hemoglobin: 11 g/dL — ABNORMAL LOW (ref 12.0–15.0)
Immature Granulocytes: 10 %
Lymphocytes Relative: 9 %
Lymphs Abs: 1.2 10*3/uL (ref 0.7–4.0)
MCH: 30.5 pg (ref 26.0–34.0)
MCHC: 35.5 g/dL (ref 30.0–36.0)
MCV: 85.9 fL (ref 80.0–100.0)
Monocytes Absolute: 1.1 10*3/uL — ABNORMAL HIGH (ref 0.1–1.0)
Monocytes Relative: 8 %
Neutro Abs: 9.2 10*3/uL — ABNORMAL HIGH (ref 1.7–7.7)
Neutrophils Relative %: 72 %
Platelets: 216 10*3/uL (ref 150–400)
RBC: 3.61 MIL/uL — ABNORMAL LOW (ref 3.87–5.11)
RDW: 15.1 % (ref 11.5–15.5)
WBC: 12.8 10*3/uL — ABNORMAL HIGH (ref 4.0–10.5)
nRBC: 0 % (ref 0.0–0.2)

## 2020-01-29 LAB — MAGNESIUM: Magnesium: 2.2 mg/dL (ref 1.7–2.4)

## 2020-01-29 LAB — D-DIMER, QUANTITATIVE: D-Dimer, Quant: 1.82 ug/mL-FEU — ABNORMAL HIGH (ref 0.00–0.50)

## 2020-01-29 LAB — FERRITIN: Ferritin: 95 ng/mL (ref 11–307)

## 2020-01-29 LAB — C-REACTIVE PROTEIN: CRP: 4.5 mg/dL — ABNORMAL HIGH (ref <1.0)

## 2020-01-29 LAB — PHOSPHORUS: Phosphorus: 2.2 mg/dL — ABNORMAL LOW (ref 2.5–4.6)

## 2020-01-29 MED ORDER — METRONIDAZOLE 500 MG PO TABS
500.0000 mg | ORAL_TABLET | Freq: Three times a day (TID) | ORAL | Status: DC
  Administered 2020-01-29 – 2020-01-30 (×3): 500 mg via ORAL
  Filled 2020-01-29 (×3): qty 1

## 2020-01-29 MED ORDER — ALBUTEROL SULFATE HFA 108 (90 BASE) MCG/ACT IN AERS
2.0000 | INHALATION_SPRAY | Freq: Four times a day (QID) | RESPIRATORY_TRACT | Status: DC | PRN
  Administered 2020-01-29: 2 via RESPIRATORY_TRACT
  Filled 2020-01-29: qty 6.7

## 2020-01-29 MED ORDER — K PHOS MONO-SOD PHOS DI & MONO 155-852-130 MG PO TABS
250.0000 mg | ORAL_TABLET | Freq: Three times a day (TID) | ORAL | Status: AC
  Administered 2020-01-29 – 2020-01-30 (×4): 250 mg via ORAL
  Filled 2020-01-29 (×4): qty 1

## 2020-01-29 MED ORDER — PREDNISONE 50 MG PO TABS
50.0000 mg | ORAL_TABLET | Freq: Every day | ORAL | Status: DC
  Administered 2020-01-30: 50 mg via ORAL
  Filled 2020-01-29: qty 1

## 2020-01-29 NOTE — Progress Notes (Signed)
Physical Therapy Treatment Patient Details Name: Katrina [name] MRN: [id number] DOB: 19-Nov-1990 Today's Date: 01/29/2020    History of Present Illness Pt is a 29yo female who presenting to hosptial with body aches, fever, SOB, and diarrhea. Found to COVID+. Also had chest pain, when for a R/L heart cath and coronoary angiography on 1/21 and transferred to from 5w to 2c.    PT Comments    Pt supine in bed and agreeable to PT session.  Mobility remains slow and guarded but able to advance gt distance this session with HHA.  Mentioned utilizing SPC at home if need to improve safety until her balance improves.  Will trial cane next session.  At this time continue to recommend HHPT.     Follow Up Recommendations  Home health PT;Supervision/Assistance - 24 hour     Equipment Recommendations  None recommended by PT    Recommendations for Other Services       Precautions / Restrictions Precautions Precautions: Fall Precaution Comments: watch BP Restrictions Other Position/Activity Restrictions: COVID +    Mobility  Bed Mobility Overal bed mobility: Modified Independent             General bed mobility comments: HOB elevated, increased time, pt moving LE slowly  Transfers Overall transfer level: Needs assistance Equipment used: None Transfers: Sit to/from Stand Sit to Stand: Supervision         General transfer comment: verbal cues for hand placement, increased time, slow and cautious, verbal cues to increase base of support  Ambulation/Gait Ambulation/Gait assistance: Min assist Gait Distance (Feet): 30 Feet Assistive device: 1 person hand held assist Gait Pattern/deviations: Step-through pattern;Decreased stride length;Shuffle Gait velocity: slow   General Gait Details: Cues for increasing stride length this session.  Pt with shuffled short steps, as gt progressed stride length improved.   Stairs             Wheelchair Mobility    Modified Rankin  (Stroke Patients Only)       Balance Overall balance assessment: Needs assistance Sitting-balance support: Feet supported;No upper extremity supported Sitting balance-Leahy Scale: Good     Standing balance support: Single extremity supported Standing balance-Leahy Scale: Poor                              Cognition Arousal/Alertness: Awake/alert Behavior During Therapy: WFL for tasks assessed/performed Overall Cognitive Status: Within Functional Limits for tasks assessed                                 General Comments: Pt remains self limiting due to pain but showed slow progress today      Exercises      General Comments        Pertinent Vitals/Pain Pain Assessment: 0-10 Pain Score: 7  Pain Location: bilat LEs> back Pain Descriptors / Indicators: Dull;Aching;Tingling Pain Intervention(s): Monitored during session;Repositioned    Home Living                      Prior Function            PT Goals (current goals can now be found in the care plan section) Acute Rehab PT Goals Patient Stated Goal: home Potential to Achieve Goals: Good Progress towards PT goals: Progressing toward goals    Frequency    Min 3X/week      PT  Plan Current plan remains appropriate    Co-evaluation              AM-PAC PT "6 Clicks" Mobility   Outcome Measure  Help needed turning from your back to your side while in a flat bed without using bedrails?: None Help needed moving from lying on your back to sitting on the side of a flat bed without using bedrails?: None Help needed moving to and from a bed to a chair (including a wheelchair)?: A Little Help needed standing up from a chair using your arms (e.g., wheelchair or bedside chair)?: A Little Help needed to walk in hospital room?: A Little Help needed climbing 3-5 steps with a railing? : A Little 6 Click Score: 20    End of Session Equipment Utilized During Treatment: Gait  belt Activity Tolerance: Patient tolerated treatment well Patient left: with call bell/phone within reach;in bed Nurse Communication: Mobility status PT Visit Diagnosis: Unsteadiness on feet (R26.81);Muscle weakness (generalized) (M62.81);Difficulty in walking, not elsewhere classified (R26.2)     Time: 0354-6568 PT Time Calculation (min) (ACUTE ONLY): 26 min  Charges:  $Gait Training: 8-22 mins $Therapeutic Activity: 8-22 mins                     [name] , PTA Acute Rehabilitation Services Pager [phone] Office [phone]     Katrina [name] Artis Delay 01/29/2020, 12:00 PM

## 2020-01-29 NOTE — Progress Notes (Signed)
PROGRESS NOTE    Katrina [name]  ZOX:[id number] DOB: 04-15-1990 DOA: 01/24/2020 PCP: Patient, No Pcp Per   Brief Narrative:   Katrina Hamptonis a 30 y.o.femalewith medical history significant ofmigraine headaches and chronic low back pain who presents with complaints of 6 days offever and diarrhea.She isnot immunized against COVID-19. Complains of associated symptoms of body aches, intermittent nonproductive cough, chest discomfort, shortness of breath, generalized crampy abdominal pain, and generalized weakness. Denies any blood in emesis or diarrhea. She has not been recently on antibiotics or steroids. Denies any tobacco, drug use, or daily drinking of alcohol. Family history is significant for a brother in his 103s who has heart failure, mother suffers from tachycardia, and dad who died of a heart attack in his 31s. Shereports previously being told that she had a heart murmur, but had not had any further work-up done in the past.Upon admission into the emergency department patient was seen to have fever up to 100.5 F, pulse 10 3-116, respirations 16-33, blood pressure 79/51-92/65 despite receiving4liters of IV fluids, and O2 saturationsmaintained on room air. COVID-19 screening was positive. Chest x-ray was clear. CT scan of the abdomen pelvis significant for inflammatory or infectious enterocolitis.EKG abnormal, cardiology consulted, given elevated troponin patient was taken for cardiac catheterization which thankfully was unremarkable.  1/25: Continue ibuprofen/colchicine. Continue phos. Pt rec's HHPT. TOC consulted for [facility]. She says she's still pretty weak today. Set goal of being able to transfer and reasonably move about the room as she says she will be home alone the first few days at discharge. Likely d/c in AM.   Assessment & Plan: Acute Myopericarditis Elevated troponin/NSTEMI - appreciate cards assistance - s/p cardiac cath; unrremarkable - started on  ibuprofen 600mg  TID for 2 - 4 weeks; colchicine 0.6mg  daily for 3 months - cards rec limiting physical activity to walking and light wt training (<5kg) for at least 3 months - BB deffered d/t hypotension, anakinra deferred - 1/25: Continue current regimen  Acuteenterocolitis - Bld Cx NTD - stool pending - remains on rocephin and flagyl - 1/25: GI PCR is negative; she's completed 5 doses of rocephin -- d/c rocephin; convert flagyl to PO to complete 1 more day  COVID-19 infection - Pt is unvaccinated - remdes, solumedrol, IS, FV, albuterol - 1/25: Sats remain good on RA; remdes completed today, convert solumedrol to prednisone  Hypokalemia Hypocalcemia Hypophosphatemia Hyponatremia - replace, follow - 1/25: continue K-phos; follow  Thrombocytopenia - likely from viral illness - no evidence of bleed - resolved  Generalized weakness     - PT recs HHPT; TOC consulted  DVT prophylaxis: SCds Code Status: FULL Family Communication: None at bedside   Status is: Inpatient  Remains inpatient appropriate because:Inpatient level of care appropriate due to severity of illness   Dispo: The patient is from: Home              Anticipated d/c is to: Home              Anticipated d/c date is: 1 day              Patient currently is not medically stable to d/c.   Difficult to place patient No  Consultants:   Cardiology  PCCM  Procedures:   [facility] 01/25/20  Subjective: "I don't think I'm ready today."  Objective: Vitals:   01/28/20 2020 01/29/20 0006 01/29/20 0443 01/29/20 0900  BP: 98/63 97/64 105/68 104/76  Pulse: 80 63 66   Resp:  17 20 19  Temp: 98.5 F (36.9 C) 98 F (36.7 C) 98.4 F (36.9 C) 98.2 F (36.8 C)  TempSrc: Oral Oral Oral Oral  SpO2: 93% 96% 92%   Weight:      Height:        Intake/Output Summary (Last 24 hours) at 01/29/2020 1318 Last data filed at 01/28/2020 1500 Gross per 24 hour   Intake 1158.68 ml  Output -  Net 1158.68 ml   Filed Weights   01/24/20 2319  Weight: 65.3 kg    Examination:  General: 30 y.o. female resting in bed in NAD Eyes: PERRL, normal sclera ENMT: Nares patent w/o discharge, orophaynx clear, dentition normal, ears w/o discharge/lesions/ulcers Neck: Supple, trachea midline Cardiovascular: RRR, +S1, S2, no m/g/r, equal pulses throughout Respiratory: CTABL, no w/r/r, normal WOB GI: BS+, NDNT, no masses noted, no organomegaly noted MSK: No e/c/c Skin: No rashes, bruises, ulcerations noted Neuro: A&O x 3, no focal deficits Psyc: Appropriate interaction and affect, calm/cooperative   Data Reviewed: I have personally reviewed following labs and imaging studies.  CBC: Recent Labs  Lab 01/25/20 1232 01/25/20 1548 01/26/20 0119 01/27/20 0111 01/28/20 0441 01/29/20 0142  WBC 10.6*  --  8.9 13.1* 15.7* 12.8*  NEUTROABS 9.7*  --  8.0* 11.2* 14.8* 9.2*  HGB 11.9* 10.9* 11.1* 11.5* 11.5* 11.0*  HCT 35.5* 32.0* 32.6* 33.0* 32.5* 31.0*  MCV 87.9  --  86.0 85.1 85.5 85.9  PLT 84*  --  91* 92* 173 216   Basic Metabolic Panel: Recent Labs  Lab 01/25/20 1232 01/25/20 1548 01/26/20 0119 01/27/20 0111 01/28/20 0441 01/29/20 0142  NA 133* 135 131* 135 137 134*  K 3.2* 3.2* 3.7 3.7 4.1 4.2  CL 98  --  99 104 106 102  CO2 24  --  22 19* 24 24  GLUCOSE 100*  --  93 120* 157* 149*  BUN 13  --  11 22* 21* 22*  CREATININE 0.77  --  0.78 0.80 0.79 0.69  CALCIUM 7.8*  --  7.4* 7.8* 8.0* 7.7*  MG 2.0  --  2.1 2.5* 2.3 2.2  PHOS  --   --  2.0* 1.8* 1.5* 2.2*   GFR: Estimated Creatinine Clearance: 99.1 mL/min (by C-G formula based on SCr of 0.69 mg/dL). Liver Function Tests: Recent Labs  Lab 01/24/20 2320 01/26/20 0119 01/27/20 0111 01/28/20 0441 01/29/20 0142  AST 42* 95* 32 25 33  ALT 34 36 32 32 34  ALKPHOS 89 92 99 108 92  BILITOT 1.2 1.1 0.5 0.5 0.7  PROT 6.9 5.1* 5.2* 5.1* 4.8*  ALBUMIN 3.0* 2.1* 2.1* 2.0* 1.9*   Recent  Labs  Lab 01/24/20 2320  LIPASE 21   No results for input(s): AMMONIA in the last 168 hours. Coagulation Profile: No results for input(s): INR, PROTIME in the last 168 hours. Cardiac Enzymes: No results for input(s): CKTOTAL, CKMB, CKMBINDEX, TROPONINI in the last 168 hours. BNP (last 3 results) No results for input(s): PROBNP in the last 8760 hours. HbA1C: No results for input(s): HGBA1C in the last 72 hours. CBG: No results for input(s): GLUCAP in the last 168 hours. Lipid Profile: No results for input(s): CHOL, HDL, LDLCALC, TRIG, CHOLHDL, LDLDIRECT in the last 72 hours. Thyroid Function Tests: No results for input(s): TSH, T4TOTAL, FREET4, T3FREE, THYROIDAB in the last 72 hours. Anemia Panel: Recent Labs    01/28/20 0441 01/29/20 0142  FERRITIN 112 95   Sepsis Labs: Recent Labs  Lab 01/25/20 0354 01/25/20 1002  PROCALCITON 4.96  --  LATICACIDVEN 1.7 1.8    Recent Results (from the past 240 hour(s))  SARS Coronavirus 2 by RT PCR (hospital order, performed in [facility] hospital lab) Nasopharyngeal Nasopharyngeal Swab     Status: Abnormal   Collection Time: 01/24/20 11:20 PM   Specimen: Nasopharyngeal Swab  Result Value Ref Range Status   SARS Coronavirus 2 POSITIVE (A) NEGATIVE Final    Comment: RESULT CALLED TO, READ BACK BY AND VERIFIED WITH: KELLIE NEAL RN ON 01/25/20 AT 0102 [facility] (NOTE) SARS-CoV-2 target nucleic acids are DETECTED  SARS-CoV-2 RNA is generally detectable in upper respiratory specimens  during the acute phase of infection.  Positive results are indicative  of the presence of the identified virus, but do not rule out bacterial infection or co-infection with other pathogens not detected by the test.  Clinical correlation with patient history and  other diagnostic information is necessary to determine patient infection status.  The expected result is negative.  Fact Sheet for Patients:   BoilerBrush.com.cy   Fact  Sheet for Healthcare Providers:   https://pope.com/    This test is not yet approved or cleared by the [place] FDA and  has been authorized for detection and/or diagnosis of SARS-CoV-2 by FDA under an Emergency Use Authorization (EUA).  This EUA will remain in effect (meaning this  test can be used) for the duration of  the COVID-19 declaration under Section 564(b)(1) of the Act, 21 U.S.C. section 360-bbb-3(b)(1), unless the authorization is terminated or revoked sooner.  Performed at [facility], [address] [place], [place] [address]   Blood Culture (routine x 2)     Status: None (Preliminary result)   Collection Time: 01/25/20  3:54 AM   Specimen: BLOOD  Result Value Ref Range Status   Specimen Description BLOOD LEFT ANTECUBITAL  Final   Special Requests   Final    BOTTLES DRAWN AEROBIC AND ANAEROBIC Blood Culture results may not be optimal due to an excessive volume of blood received in culture bottles   Culture   Final    NO GROWTH 3 DAYS Performed at [facility], [address], [place], [place] [address]    Report Status PENDING  Incomplete  Blood Culture (routine x 2)     Status: None (Preliminary result)   Collection Time: 01/25/20  3:54 AM   Specimen: BLOOD LEFT HAND  Result Value Ref Range Status   Specimen Description BLOOD LEFT HAND  Final   Special Requests   Final    BOTTLES DRAWN AEROBIC AND ANAEROBIC Blood Culture adequate volume   Culture   Final    NO GROWTH 3 DAYS Performed at [facility], [address] [place], [place] [address]    Report Status PENDING  Incomplete  Gastrointestinal Panel by PCR , Stool     Status: None   Collection Time: 01/25/20 11:45 AM   Specimen: Stool  Result Value Ref Range Status   Campylobacter species NOT DETECTED NOT DETECTED Final   Plesimonas shigelloides NOT DETECTED NOT DETECTED Final   Salmonella species NOT DETECTED NOT DETECTED Final   Yersinia  enterocolitica NOT DETECTED NOT DETECTED Final   Vibrio species NOT DETECTED NOT DETECTED Final   Vibrio cholerae NOT DETECTED NOT DETECTED Final   Enteroaggregative E coli (EAEC) NOT DETECTED NOT DETECTED Final   Enteropathogenic E coli (EPEC) NOT DETECTED NOT DETECTED Final   Enterotoxigenic E coli (ETEC) NOT DETECTED NOT DETECTED Final   Shiga like toxin  producing E coli (STEC) NOT DETECTED NOT DETECTED Final   Shigella/Enteroinvasive E coli (EIEC) NOT DETECTED NOT DETECTED Final   Cryptosporidium NOT DETECTED NOT DETECTED Final   Cyclospora cayetanensis NOT DETECTED NOT DETECTED Final   Entamoeba histolytica NOT DETECTED NOT DETECTED Final   Giardia lamblia NOT DETECTED NOT DETECTED Final   Adenovirus F40/41 NOT DETECTED NOT DETECTED Final   Astrovirus NOT DETECTED NOT DETECTED Final   Norovirus GI/GII NOT DETECTED NOT DETECTED Final   Rotavirus A NOT DETECTED NOT DETECTED Final   Sapovirus (I, II, IV, and V) NOT DETECTED NOT DETECTED Final    Comment: Performed at [facility], [address] [place], [place] [address]  MRSA PCR Screening     Status: Abnormal   Collection Time: 01/26/20  2:35 PM   Specimen: Nasal Mucosa; Nasopharyngeal  Result Value Ref Range Status   MRSA by PCR POSITIVE (A) NEGATIVE Final    Comment:        The GeneXpert MRSA Assay (FDA approved for NASAL specimens only), is one component of a comprehensive MRSA colonization surveillance program. It is not intended to diagnose MRSA infection nor to guide or monitor treatment for MRSA infections. RESULT CALLED TO, READ BACK BY AND VERIFIED WITH: RN CHARGE NURSE 01/26/20 AT 1635 DW Performed at [facility], [address], [place], [place] [address]       Radiology Studies: No results found.   Scheduled Meds: . vitamin C  500 mg Oral Daily  . colchicine  0.6 mg Oral Daily  . famotidine  20 mg Oral BID  . ibuprofen  600 mg Oral TID WC  . methylPREDNISolone (SOLU-MEDROL) injection   0.5 mg/kg Intravenous Q12H  . metroNIDAZOLE  500 mg Oral Q8H  . mupirocin ointment   Nasal BID  . phosphorus  250 mg Oral TID  . sodium chloride flush  3 mL Intravenous Q12H  . zinc sulfate  220 mg Oral Daily   Continuous Infusions: . sodium chloride    . cefTRIAXone (ROCEPHIN)  IV 2 g (01/29/20 1127)  . lactated ringers Stopped (01/25/20 0510)  . lactated ringers 200 mL/hr at 01/25/20 0735  . sodium chloride       LOS: 4 days    Time spent: 25 minutes spent in the coordination of care today.   Teddy Spike, DO [facility]  If 7PM-7AM, please contact night-coverage [email or web address] 01/29/2020, 1:18 PM

## 2020-01-30 ENCOUNTER — Other Ambulatory Visit (HOSPITAL_COMMUNITY): Payer: Self-pay | Admitting: Internal Medicine

## 2020-01-30 DIAGNOSIS — U071 COVID-19: Secondary | ICD-10-CM | POA: Diagnosis not present

## 2020-01-30 DIAGNOSIS — B3321 Viral endocarditis: Secondary | ICD-10-CM | POA: Diagnosis not present

## 2020-01-30 LAB — CBC WITH DIFFERENTIAL/PLATELET
Abs Immature Granulocytes: 0 K/uL (ref 0.00–0.07)
Basophils Absolute: 0 K/uL (ref 0.0–0.1)
Basophils Relative: 0 %
Eosinophils Absolute: 0.2 K/uL (ref 0.0–0.5)
Eosinophils Relative: 1 %
HCT: 31.9 % — ABNORMAL LOW (ref 36.0–46.0)
Hemoglobin: 11.2 g/dL — ABNORMAL LOW (ref 12.0–15.0)
Lymphocytes Relative: 17 %
Lymphs Abs: 2.6 K/uL (ref 0.7–4.0)
MCH: 30.9 pg (ref 26.0–34.0)
MCHC: 35.1 g/dL (ref 30.0–36.0)
MCV: 87.9 fL (ref 80.0–100.0)
Monocytes Absolute: 0.6 K/uL (ref 0.1–1.0)
Monocytes Relative: 4 %
Neutro Abs: 12.1 K/uL — ABNORMAL HIGH (ref 1.7–7.7)
Neutrophils Relative %: 78 %
Platelets: 268 K/uL (ref 150–400)
RBC: 3.63 MIL/uL — ABNORMAL LOW (ref 3.87–5.11)
RDW: 15.6 % — ABNORMAL HIGH (ref 11.5–15.5)
WBC: 15.5 K/uL — ABNORMAL HIGH (ref 4.0–10.5)
nRBC: 0 /100{WBCs}
nRBC: 0.2 % (ref 0.0–0.2)

## 2020-01-30 LAB — COMPREHENSIVE METABOLIC PANEL WITH GFR
ALT: 43 U/L (ref 0–44)
AST: 38 U/L (ref 15–41)
Albumin: 2 g/dL — ABNORMAL LOW (ref 3.5–5.0)
Alkaline Phosphatase: 86 U/L (ref 38–126)
Anion gap: 6 (ref 5–15)
BUN: 23 mg/dL — ABNORMAL HIGH (ref 6–20)
CO2: 27 mmol/L (ref 22–32)
Calcium: 7.8 mg/dL — ABNORMAL LOW (ref 8.9–10.3)
Chloride: 102 mmol/L (ref 98–111)
Creatinine, Ser: 0.66 mg/dL (ref 0.44–1.00)
GFR, Estimated: >60 mL/min
Glucose, Bld: 126 mg/dL — ABNORMAL HIGH (ref 70–99)
Potassium: 5 mmol/L (ref 3.5–5.1)
Sodium: 135 mmol/L (ref 135–145)
Total Bilirubin: 0.5 mg/dL (ref 0.3–1.2)
Total Protein: 4.8 g/dL — ABNORMAL LOW (ref 6.5–8.1)

## 2020-01-30 LAB — CULTURE, BLOOD (ROUTINE X 2)
Culture: NO GROWTH
Culture: NO GROWTH
Special Requests: ADEQUATE

## 2020-01-30 LAB — FERRITIN: Ferritin: 103 ng/mL (ref 11–307)

## 2020-01-30 LAB — C-REACTIVE PROTEIN: CRP: 2.8 mg/dL — ABNORMAL HIGH (ref <1.0)

## 2020-01-30 LAB — D-DIMER, QUANTITATIVE: D-Dimer, Quant: 3.62 ug/mL-FEU — ABNORMAL HIGH (ref 0.00–0.50)

## 2020-01-30 LAB — PHOSPHORUS: Phosphorus: 2.9 mg/dL (ref 2.5–4.6)

## 2020-01-30 LAB — MAGNESIUM: Magnesium: 2.1 mg/dL (ref 1.7–2.4)

## 2020-01-30 MED ORDER — PREDNISONE 10 MG PO TABS: ORAL_TABLET | ORAL | 0 refills | Status: DC

## 2020-01-30 MED ORDER — WHITE PETROLATUM EX OINT
TOPICAL_OINTMENT | CUTANEOUS | Status: AC
  Filled 2020-01-30: qty 28.35

## 2020-01-30 MED ORDER — COLCHICINE 0.6 MG PO TABS: 0.6000 mg | ORAL_TABLET | Freq: Every day | ORAL | 2 refills | Status: DC

## 2020-01-30 MED ORDER — IBUPROFEN 600 MG PO TABS: 600.0000 mg | ORAL_TABLET | Freq: Three times a day (TID) | ORAL | 0 refills | Status: DC

## 2020-01-30 MED ORDER — ONDANSETRON HCL 4 MG PO TABS: 4.0000 mg | ORAL_TABLET | Freq: Four times a day (QID) | ORAL | 0 refills | Status: DC | PRN

## 2020-01-30 MED ORDER — GUAIFENESIN-DM 100-10 MG/5ML PO SYRP: 10.0000 mL | ORAL_SOLUTION | ORAL | 0 refills | Status: DC | PRN

## 2020-01-30 MED ORDER — COLCHICINE 0.6 MG PO TABS: 0.6000 mg | ORAL_TABLET | Freq: Every day | ORAL | Status: DC

## 2020-01-30 MED FILL — predniSONE 10 MG TABS: 10 | 12 days supply | Qty: 30 | Fill #0

## 2020-01-30 MED FILL — ONDANSETRON HCL 4 MG TABLET: 4 | 5 days supply | Qty: 20 | Fill #0

## 2020-01-30 MED FILL — MITIGARE 0.6 MG CAPSULE: 0.6 | 30 days supply | Qty: 30 | Fill #0

## 2020-01-30 MED FILL — IBUPROFEN 600 MG TABLET: 600 | 30 days supply | Qty: 90 | Fill #0

## 2020-01-30 NOTE — Discharge Summary (Signed)
Physician Discharge Summary  [name]:[id number] DOB: 08-15-1990 DOA: 01/24/2020  PCP: Patient, No Pcp Per  Admit date: 01/24/2020 Discharge date: 01/30/2020  Admitted From: Home Disposition: Home  Recommendations for Outpatient Follow-up:  1. Follow up with PCP in 1-2 weeks 2. Please obtain BMP/CBC in one week 3. Cardiology as scheduled  Home Health: Home health with PT Equipment/Devices: None  Discharge Condition: Stable CODE STATUS: Full Diet recommendation: As tolerated  Brief/Interim Summary: Katrina Hamptonis a 30 y.o.femalewith medical history significant ofmigraine headaches and chronic low back pain who presents with complaints of 6 days offever and diarrhea.She isnot immunized against COVID-19. Complains of associated symptoms of body aches, intermittent nonproductive cough, chest discomfort, shortness of breath, generalized crampy abdominal pain, and generalized weakness. Denies any blood in emesis or diarrhea. She has not been recently on antibiotics or steroids. Denies any tobacco, drug use, or daily drinking of alcohol. Family history is significant for a brother in his 56s who has heart failure, mother suffers from tachycardia, and dad who died of a heart attack in his 70s. Shereports previously being told that she had a heart murmur, but had not had any further work-up done in the past.Upon admission into the emergency department patient was seen to have fever up to 100.5 F, pulse 10 3-116, respirations 16-33, blood pressure 79/51-92/65 despite receiving4liters of IV fluids, and O2 saturationsmaintained on room air. COVID-19 screening was positive. Chest x-ray was clear. CT scan of the abdomen pelvis significant for inflammatory or infectious enterocolitis.EKG abnormal, cardiology consulted, given elevated troponin patient was taken for cardiac catheterization which thankfully was unremarkable.  Patient continued to improve in both GI symptoms and  cardiac symptoms with resolving weakness, dyspnea with exertion, blood pressure remained stable otherwise stable and agreeable for discharge home.  Per cardiology continue ibuprofen for 4 weeks and colchicine for 3 months with repeat echo planned at the end of 3 months.  Patient otherwise continues to feel somewhat fatigued and weak but markedly improved from admission otherwise stable and agreeable for discharge, close follow-up with PCP in the next 1 to 2 weeks as discussed.  We discussed ongoing quarantine for Covid virus for additional week to 10 days given minimal respiratory symptoms.  Discharge Diagnoses:  Active Problems:   NSTEMI (non-ST elevated myocardial infarction) (HCC)   Severe sepsis with septic shock (HCC)   COVID-19 virus infection   Hypokalemia   Thrombocytopenia (HCC)   Enterocolitis   Elevated troponin    Discharge Instructions  Discharge Instructions    Diet - low sodium heart healthy   Complete by: As directed    Increase activity slowly   Complete by: As directed      Allergies as of 01/30/2020      Reactions   Aspirin Hives, Rash   Sulfamethoxazole-trimethoprim Nausea And Vomiting      Medication List    TAKE these medications   colchicine 0.6 MG tablet Take 1 tablet (0.6 mg total) by mouth daily. Start taking on: January 31, 2020   guaiFENesin-dextromethorphan 100-10 MG/5ML syrup Commonly known as: ROBITUSSIN DM Take 10 mLs by mouth every 4 (four) hours as needed for cough.   ibuprofen 600 MG tablet Commonly known as: ADVIL Take 1 tablet (600 mg total) by mouth 3 (three) times daily with meals.   ondansetron 4 MG tablet Commonly known as: ZOFRAN Take 1 tablet (4 mg total) by mouth every 6 (six) hours as needed for nausea.   predniSONE 10 MG tablet Commonly known as: DELTASONE  Take 4 tablets (40 mg total) by mouth daily for 3 days, THEN 3 tablets (30 mg total) daily for 3 days, THEN 2 tablets (20 mg total) daily for 3 days, THEN 1 tablet (10  mg total) daily for 3 days. Start taking on: January 30, 2020       Follow-up Information    Christell Constant, MD. Go on 02/20/2020.   Specialty: Cardiology Why: @9am  for hospital follow up Contact information: [address] [place] [address] [phone]              Allergies  Allergen Reactions  . Aspirin Hives and Rash  . Sulfamethoxazole-Trimethoprim Nausea And Vomiting    Consultations: Cardiology, PCCM  Procedures/Studies: CT ANGIO CHEST PE W OR WO CONTRAST  Result Date: 01/25/2020 CLINICAL DATA:  COVID-19 infection, shortness of breath, cough, fever and vomiting. Elevated D-dimer and elevated troponin I levels. EXAM: CT ANGIOGRAPHY CHEST WITH CONTRAST TECHNIQUE: Multidetector CT imaging of the chest was performed using the standard protocol during bolus administration of intravenous contrast. Multiplanar CT image reconstructions and MIPs were obtained to evaluate the vascular anatomy. CONTRAST:  OMNIPAQUE IOHEXOL 350 MG/ML SOLN COMPARISON:  Chest x-ray earlier today. FINDINGS: Cardiovascular: Pulmonary arteries are well opacified and of normal caliber. No evidence of pulmonary embolism. The thoracic aorta is normal in caliber. The heart size is top normal. Suggestion possibly mild dilatation of the left ventricle by CT. No pericardial fluid identified. No visible calcified coronary artery plaque. Mediastinum/Nodes: No enlarged mediastinal, hilar, or axillary lymph nodes. Thyroid gland, trachea, and esophagus demonstrate no significant findings. Lungs/Pleura: Trace pleural fluid in the inferior right hemithorax. Atelectasis and scarring along the inferior aspect of the left major fissure extending into the inferior lingula. Mild atelectasis versus subtle infiltrate at the posterior right lung base. Otherwise no significant airspace disease or widespread evidence of COVID pneumonia. Tiny nonspecific 3 mm nodule in the right middle lobe is likely  benign. No overt pulmonary edema or pneumothorax. Upper Abdomen: No acute abnormality. Musculoskeletal: No bony abnormalities. The body wall soft tissues of the chest and visualized upper abdomen demonstrate suggestion edema in the body wall fat suggestive of anasarca. Review of the MIP images confirms the above findings. IMPRESSION: 1. No evidence of pulmonary embolism. 2. Mild atelectasis versus subtle infiltrate at the posterior right lung base. Otherwise no significant airspace disease or evidence of significant COVID pneumonia. 3. Trace pleural fluid in the inferior right hemithorax. 4. Suggestion of possibly mild dilatation of the left ventricle by CT. 5. Suggestion of edema in the body wall fat suggestive of anasarca. 6. Small nonspecific 3 mm nodule in the right middle lobe is likely benign/postinflammatory. Electronically Signed   By: Irish Lack M.D.   On: 01/25/2020 13:28   CT ABDOMEN PELVIS W CONTRAST  Result Date: 01/25/2020 CLINICAL DATA:  Patient with COVID symptoms including fever vomiting and diarrhea and body aches. EXAM: CT ABDOMEN AND PELVIS WITH CONTRAST TECHNIQUE: Multidetector CT imaging of the abdomen and pelvis was performed using the standard protocol following bolus administration of intravenous contrast. CONTRAST:  80mL OMNIPAQUE IOHEXOL 300 MG/ML  SOLN COMPARISON:  None. FINDINGS: Lower chest: There is an area of atelectasis in the lingula. There is a small right-sided pleural effusion. There is a trace pericardial effusion.The heart size is normal. Hepatobiliary: The liver is normal. Normal gallbladder.There is no biliary ductal dilation. Pancreas: Normal contours without ductal dilatation. No peripancreatic fluid collection. Spleen: Unremarkable. Adrenals/Urinary Tract: --Adrenal glands: Unremarkable. --  Right kidney/ureter: No hydronephrosis or radiopaque kidney stones. --Left kidney/ureter: No hydronephrosis or radiopaque kidney stones. --Urinary bladder: Unremarkable.  Stomach/Bowel: --Stomach/Duodenum: No hiatal hernia or other gastric abnormality. Normal duodenal course and caliber. --Small bowel: There are mildly dilated fluid-filled loops of small bowel scattered throughout abdomen, several which demonstrate hyperenhancement and mild wall thickening. --Colon: There appears to be some mild wall thickening of the colon and rectum. There is liquid stool throughout the colon. --Appendix: Normal. Vascular/Lymphatic: Normal course and caliber of the major abdominal vessels. --No retroperitoneal lymphadenopathy. --No mesenteric lymphadenopathy. --No pelvic or inguinal lymphadenopathy. Reproductive: Unremarkable Other: There is a small volume of free fluid in the abdomen pelvis. The abdominal wall is normal. Musculoskeletal. No acute displaced fractures. IMPRESSION: 1. There are mildly dilated fluid-filled loops of small bowel scattered throughout abdomen, several which demonstrate hyperenhancement and mild wall thickening. There is some mild wall thickening of the colon. Findings are concerning for infectious or inflammatory enterocolitis. 2. Small volume of free fluid in the abdomen pelvis. 3. Small right-sided pleural effusion. 4. Trace pericardial effusion. Electronically Signed   By: Katherine Mantle M.D.   On: 01/25/2020 04:58   CARDIAC CATHETERIZATION  Result Date: 01/25/2020 1. No angiographic evidence of CAD 2. Normal filling pressures 3. Elevated troponin likely due demand ischemia from viral syndrome/hypotension/dehydration. Recommendations: No further ischemic evaluation.   DG Chest Portable 1 View  Result Date: 01/25/2020 CLINICAL DATA:  Shortness of breath EXAM: PORTABLE CHEST 1 VIEW COMPARISON:  None. FINDINGS: The heart size and mediastinal contours are within normal limits. Both lungs are clear. The visualized skeletal structures are unremarkable. IMPRESSION: No active disease. Electronically Signed   By: Jonna Clark M.D.   On: 01/25/2020 00:36    ECHOCARDIOGRAM LIMITED  Result Date: 01/25/2020    ECHOCARDIOGRAM LIMITED REPORT   Patient Name:   ELADIA FRAME Date of Exam: 01/25/2020 Medical Rec #:  [id number]     Height:       67.0 in Accession #:    [id number]    Weight:       144.0 lb Date of Birth:  1990/10/28     BSA:          1.759 m Patient Age:    29 years      BP:           85/56 mmHg Patient Gender: F             HR:           102 bpm. Exam Location:  Inpatient Procedure: Cardiac Doppler, Limited Echo and Limited Color Doppler                                MODIFIED REPORT:     This report was modified by Chilton Si MD on 01/25/2020 due to RV                                  hypokinesis.  Indications:     Elevated Troponin  History:         Patient has no prior history of Echocardiogram examinations.  Sonographer:     Eulah Pont RDCS Referring Phys:  Judye Bos DUNN Diagnosing Phys: Chilton Si MD IMPRESSIONS  1. Posterolateral hypokinesis. Hypokinesis of the basal to mid regions with apical sparing. 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 Left ventricular ejection  fraction, by estimation, is 40 to 45%. The left ventricle has mildly decreased function. The left ventricle demonstrates regional wall motion abnormalities (see scoring diagram/findings for description). Left ventricular diastolic parameters are consistent with Grade II diastolic dysfunction (pseudonormalization).  2. Hypokinesis of the apical RV. Right ventricular systolic function is mildly reduced. There is normal pulmonary artery systolic pressure.  3. A small pericardial effusion is present.  4. Mild mitral valve regurgitation.  5. The aortic valve is tricuspid.  6. The inferior vena cava is normal in size with <50% respiratory variability, suggesting right atrial pressure of 8 mmHg. FINDINGS  Left Ventricle: Posterolateral hypokinesis. Hypokinesis of the basal to mid regions with apical sparing. 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  Left ventricular ejection fraction, by estimation, is 40 to 45%. The left ventricle has mildly  decreased function. The left ventricle demonstrates regional wall motion abnormalities. Left ventricular diastolic parameters are consistent with Grade II diastolic dysfunction (pseudonormalization). Right Ventricle: Hypokinesis of the apical RV. Right ventricular systolic function is mildly reduced. There is normal pulmonary artery systolic pressure. The tricuspid regurgitant velocity is 1.83 m/s, and with an assumed right atrial pressure of 8 mmHg,  the estimated right ventricular systolic pressure is 21.4 mmHg. Pericardium: A small pericardial effusion is present. Mitral Valve: Mild mitral valve regurgitation. Tricuspid Valve: Tricuspid valve regurgitation is mild. Aortic Valve: The aortic valve is tricuspid. Pulmonic Valve: Pulmonic valve regurgitation is trivial. Venous: The inferior vena cava is normal in size with less than 50% respiratory variability, suggesting right atrial pressure of 8 mmHg. LEFT VENTRICLE PLAX 2D LVIDd:         4.20 cm     Diastology LVIDs:         3.10 cm     LV e' medial:    9.37 cm/s LV PW:         0.70 cm     LV E/e' medial:  9.8 LV IVS:        0.60 cm     LV e' lateral:   8.67 cm/s LVOT diam:     1.90 cm     LV E/e' lateral: 10.6 LV SV:         46 LV SV Index:   26 LVOT Area:     2.84 cm  LV Volumes (MOD) LV vol d, MOD A2C: 78.1 ml LV vol d, MOD A4C: 74.4 ml LV vol s, MOD A2C: 39.9 ml LV vol s, MOD A4C: 39.1 ml LV SV MOD A2C:     38.2 ml LV SV MOD A4C:     74.4 ml LV SV MOD BP:      37.1 ml RIGHT VENTRICLE RV S prime:     10.70 cm/s TAPSE (M-mode): 1.8 cm LEFT ATRIUM         Index LA diam:    2.70 cm 1.54 cm/m  AORTIC VALVE LVOT Vmax:   102.00 cm/s LVOT Vmean:  82.600 cm/s LVOT VTI:    0.162 m  AORTA Ao Root diam: 2.40 cm MITRAL VALVE               TRICUSPID VALVE MV Area (PHT): 5.42 cm    TR Peak grad:   13.4 mmHg MV Decel Time: 140 msec    TR Vmax:        183.00 cm/s MV E velocity:  92.10 cm/s MV A velocity: 52.30 cm/s  SHUNTS MV E/A ratio:  1.76        Systemic VTI:  0.16 m                            Systemic Diam: 1.90 cm Chilton Si MD Electronically signed by Chilton Si MD Signature Date/Time: 01/25/2020/12:14:57 PM    Final (Updated)      Subjective: No acute issues or events overnight, generally fatigued and weak but markedly improving from previous, denies chest pain, shortness of breath,, nausea, vomiting, diarrhea, constipation, headache, fevers, or chills.   Discharge Exam: Vitals:   01/30/20 0325 01/30/20 0815  BP: (!) 96/59 98/65  Pulse: 69   Resp: 16   Temp: 98.3 F (36.8 C) 97.6 F (36.4 C)  SpO2: 96%    Vitals:   01/29/20 1654 01/29/20 1955 01/30/20 0325 01/30/20 0815  BP: 95/67 97/66 (!) 96/59 98/65  Pulse:  74 69   Resp:  20 16   Temp: 98.5 F (36.9 C) 98.6 F (37 C) 98.3 F (36.8 C) 97.6 F (36.4 C)  TempSrc: Oral Oral Oral Oral  SpO2:  97% 96%   Weight:      Height:        General: Pt is alert, awake, not in acute distress Cardiovascular: RRR, S1/S2 +, no rubs, no gallops Respiratory: CTA bilaterally, no wheezing, no rhonchi Abdominal: Soft, NT, ND, bowel sounds + Extremities: no edema, no cyanosis    The results of significant diagnostics from this hospitalization (including imaging, microbiology, ancillary and laboratory) are listed below for reference.     Microbiology: Recent Results (from the past 240 hour(s))  SARS Coronavirus 2 by RT PCR (hospital order, performed in [facility] hospital lab) Nasopharyngeal Nasopharyngeal Swab     Status: Abnormal   Collection Time: 01/24/20 11:20 PM   Specimen: Nasopharyngeal Swab  Result Value Ref Range Status   SARS Coronavirus 2 POSITIVE (A) NEGATIVE Final    Comment: RESULT CALLED TO, READ BACK BY AND VERIFIED WITH: KELLIE NEAL RN ON 01/25/20 AT 0102 [facility] (NOTE) SARS-CoV-2 target nucleic acids are DETECTED  SARS-CoV-2 RNA is generally detectable in upper respiratory  specimens  during the acute phase of infection.  Positive results are indicative  of the presence of the identified virus, but do not rule out bacterial infection or co-infection with other pathogens not detected by the test.  Clinical correlation with patient history and  other diagnostic information is necessary to determine patient infection status.  The expected result is negative.  Fact Sheet for Patients:   BoilerBrush.com.cy   Fact Sheet for Healthcare Providers:   https://pope.com/    This test is not yet approved or cleared by the [place] FDA and  has been authorized for detection and/or diagnosis of SARS-CoV-2 by FDA under an Emergency Use Authorization (EUA).  This EUA will remain in effect (meaning this  test can be used) for the duration of  the COVID-19 declaration under Section 564(b)(1) of the Act, 21 U.S.C. section 360-bbb-3(b)(1), unless the authorization is terminated or revoked sooner.  Performed at [facility], [address] [place], [place] [address]   Blood Culture (routine x 2)     Status: None   Collection Time: 01/25/20  3:54 AM   Specimen: BLOOD  Result Value Ref Range Status   Specimen Description BLOOD LEFT ANTECUBITAL  Final   Special Requests   Final    BOTTLES DRAWN AEROBIC AND ANAEROBIC Blood Culture results may not be optimal due to an excessive volume of blood received  in culture bottles   Culture   Final    NO GROWTH 5 DAYS Performed at [facility], [address] [place], [place] [address]    Report Status 01/30/2020 FINAL  Final  Blood Culture (routine x 2)     Status: None   Collection Time: 01/25/20  3:54 AM   Specimen: BLOOD LEFT HAND  Result Value Ref Range Status   Specimen Description BLOOD LEFT HAND  Final   Special Requests   Final    BOTTLES DRAWN AEROBIC AND ANAEROBIC Blood Culture adequate volume   Culture   Final    NO GROWTH 5 DAYS Performed at  [facility], [address], [place], [place] [address]    Report Status 01/30/2020 FINAL  Final  Gastrointestinal Panel by PCR , Stool     Status: None   Collection Time: 01/25/20 11:45 AM   Specimen: Stool  Result Value Ref Range Status   Campylobacter species NOT DETECTED NOT DETECTED Final   Plesimonas shigelloides NOT DETECTED NOT DETECTED Final   Salmonella species NOT DETECTED NOT DETECTED Final   Yersinia enterocolitica NOT DETECTED NOT DETECTED Final   Vibrio species NOT DETECTED NOT DETECTED Final   Vibrio cholerae NOT DETECTED NOT DETECTED Final   Enteroaggregative E coli (EAEC) NOT DETECTED NOT DETECTED Final   Enteropathogenic E coli (EPEC) NOT DETECTED NOT DETECTED Final   Enterotoxigenic E coli (ETEC) NOT DETECTED NOT DETECTED Final   Shiga like toxin producing E coli (STEC) NOT DETECTED NOT DETECTED Final   Shigella/Enteroinvasive E coli (EIEC) NOT DETECTED NOT DETECTED Final   Cryptosporidium NOT DETECTED NOT DETECTED Final   Cyclospora cayetanensis NOT DETECTED NOT DETECTED Final   Entamoeba histolytica NOT DETECTED NOT DETECTED Final   Giardia lamblia NOT DETECTED NOT DETECTED Final   Adenovirus F40/41 NOT DETECTED NOT DETECTED Final   Astrovirus NOT DETECTED NOT DETECTED Final   Norovirus GI/GII NOT DETECTED NOT DETECTED Final   Rotavirus A NOT DETECTED NOT DETECTED Final   Sapovirus (I, II, IV, and V) NOT DETECTED NOT DETECTED Final    Comment: Performed at [facility], [address] [place], [place] [address]  MRSA PCR Screening     Status: Abnormal   Collection Time: 01/26/20  2:35 PM   Specimen: Nasal Mucosa; Nasopharyngeal  Result Value Ref Range Status   MRSA by PCR POSITIVE (A) NEGATIVE Final    Comment:        The GeneXpert MRSA Assay (FDA approved for NASAL specimens only), is one component of a comprehensive MRSA colonization surveillance program. It is not intended to diagnose MRSA infection nor to guide or monitor treatment  for MRSA infections. RESULT CALLED TO, READ BACK BY AND VERIFIED WITH: RN CHARGE NURSE 01/26/20 AT 1635 DW Performed at [facility], [address], [place], [place] [address]      Labs: BNP (last 3 results) No results for input(s): BNP in the last 8760 hours. Basic Metabolic Panel: Recent Labs  Lab 01/26/20 0119 01/27/20 0111 01/28/20 0441 01/29/20 0142 01/30/20 0022  NA 131* 135 137 134* 135  K 3.7 3.7 4.1 4.2 5.0  CL 99 104 106 102 102  CO2 22 19* 24 24 27   GLUCOSE 93 120* 157* 149* 126*  BUN 11 22* 21* 22* 23*  CREATININE 0.78 0.80 0.79 0.69 0.66  CALCIUM 7.4* 7.8* 8.0* 7.7* 7.8*  MG 2.1 2.5* 2.3 2.2 2.1  PHOS 2.0* 1.8* 1.5* 2.2* 2.9  Liver Function Tests: Recent Labs  Lab 01/26/20 0119 01/27/20 0111 01/28/20 0441 01/29/20 0142 01/30/20 0022  AST 95* 32 25 33 38  ALT 36 32 32 34 43  ALKPHOS 92 99 108 92 86  BILITOT 1.1 0.5 0.5 0.7 0.5  PROT 5.1* 5.2* 5.1* 4.8* 4.8*  ALBUMIN 2.1* 2.1* 2.0* 1.9* 2.0*   Recent Labs  Lab 01/24/20 2320  LIPASE 21   No results for input(s): AMMONIA in the last 168 hours. CBC: Recent Labs  Lab 01/26/20 0119 01/27/20 0111 01/28/20 0441 01/29/20 0142 01/30/20 0022  WBC 8.9 13.1* 15.7* 12.8* 15.5*  NEUTROABS 8.0* 11.2* 14.8* 9.2* 12.1*  HGB 11.1* 11.5* 11.5* 11.0* 11.2*  HCT 32.6* 33.0* 32.5* 31.0* 31.9*  MCV 86.0 85.1 85.5 85.9 87.9  PLT 91* 92* 173 216 268   Cardiac Enzymes: No results for input(s): CKTOTAL, CKMB, CKMBINDEX, TROPONINI in the last 168 hours. BNP: Invalid input(s): POCBNP CBG: No results for input(s): GLUCAP in the last 168 hours. D-Dimer Recent Labs    01/29/20 0142 01/30/20 0022  DDIMER 1.82* 3.62*   Hgb A1c No results for input(s): HGBA1C in the last 72 hours. Lipid Profile No results for input(s): CHOL, HDL, LDLCALC, TRIG, CHOLHDL, LDLDIRECT in the last 72 hours. Thyroid function studies No results for input(s): TSH, T4TOTAL, T3FREE, THYROIDAB in the last 72 hours.  Invalid  input(s): FREET3 Anemia work up Recent Labs    01/29/20 0142 01/30/20 0022  FERRITIN 95 103   Urinalysis    Component Value Date/Time   COLORURINE AMBER (A) 01/27/2020 1259   APPEARANCEUR CLEAR 01/27/2020 1259   LABSPEC 1.030 01/27/2020 1259   PHURINE 6.0 01/27/2020 1259   GLUCOSEU NEGATIVE 01/27/2020 1259   HGBUR NEGATIVE 01/27/2020 1259   BILIRUBINUR NEGATIVE 01/27/2020 1259   KETONESUR NEGATIVE 01/27/2020 1259   PROTEINUR NEGATIVE 01/27/2020 1259   NITRITE NEGATIVE 01/27/2020 1259   LEUKOCYTESUR SMALL (A) 01/27/2020 1259   Sepsis Labs Invalid input(s): PROCALCITONIN,  WBC,  LACTICIDVEN Microbiology Recent Results (from the past 240 hour(s))  SARS Coronavirus 2 by RT PCR (hospital order, performed in [facility] hospital lab) Nasopharyngeal Nasopharyngeal Swab     Status: Abnormal   Collection Time: 01/24/20 11:20 PM   Specimen: Nasopharyngeal Swab  Result Value Ref Range Status   SARS Coronavirus 2 POSITIVE (A) NEGATIVE Final    Comment: RESULT CALLED TO, READ BACK BY AND VERIFIED WITH: KELLIE NEAL RN ON 01/25/20 AT 0102 [facility] (NOTE) SARS-CoV-2 target nucleic acids are DETECTED  SARS-CoV-2 RNA is generally detectable in upper respiratory specimens  during the acute phase of infection.  Positive results are indicative  of the presence of the identified virus, but do not rule out bacterial infection or co-infection with other pathogens not detected by the test.  Clinical correlation with patient history and  other diagnostic information is necessary to determine patient infection status.  The expected result is negative.  Fact Sheet for Patients:   BoilerBrush.com.cy   Fact Sheet for Healthcare Providers:   https://pope.com/    This test is not yet approved or cleared by the [place] FDA and  has been authorized for detection and/or diagnosis of SARS-CoV-2 by FDA under an Emergency Use Authorization (EUA).  This  EUA will remain in effect (meaning this  test can be used) for the duration of  the COVID-19 declaration under Section 564(b)(1) of the Act, 21 U.S.C. section 360-bbb-3(b)(1), unless the authorization is terminated or revoked sooner.  Performed at [facility],  [address] [place], [place] [address]   Blood Culture (routine x 2)     Status: None   Collection Time: 01/25/20  3:54 AM   Specimen: BLOOD  Result Value Ref Range Status   Specimen Description BLOOD LEFT ANTECUBITAL  Final   Special Requests   Final    BOTTLES DRAWN AEROBIC AND ANAEROBIC Blood Culture results may not be optimal due to an excessive volume of blood received in culture bottles   Culture   Final    NO GROWTH 5 DAYS Performed at [facility], [address], [place], [place] [address]    Report Status 01/30/2020 FINAL  Final  Blood Culture (routine x 2)     Status: None   Collection Time: 01/25/20  3:54 AM   Specimen: BLOOD LEFT HAND  Result Value Ref Range Status   Specimen Description BLOOD LEFT HAND  Final   Special Requests   Final    BOTTLES DRAWN AEROBIC AND ANAEROBIC Blood Culture adequate volume   Culture   Final    NO GROWTH 5 DAYS Performed at [facility], [address], [place], [place] [address]    Report Status 01/30/2020 FINAL  Final  Gastrointestinal Panel by PCR , Stool     Status: None   Collection Time: 01/25/20 11:45 AM   Specimen: Stool  Result Value Ref Range Status   Campylobacter species NOT DETECTED NOT DETECTED Final   Plesimonas shigelloides NOT DETECTED NOT DETECTED Final   Salmonella species NOT DETECTED NOT DETECTED Final   Yersinia enterocolitica NOT DETECTED NOT DETECTED Final   Vibrio species NOT DETECTED NOT DETECTED Final   Vibrio cholerae NOT DETECTED NOT DETECTED Final   Enteroaggregative E coli (EAEC) NOT DETECTED NOT DETECTED Final   Enteropathogenic E coli (EPEC) NOT DETECTED NOT DETECTED Final   Enterotoxigenic E coli (ETEC) NOT  DETECTED NOT DETECTED Final   Shiga like toxin producing E coli (STEC) NOT DETECTED NOT DETECTED Final   Shigella/Enteroinvasive E coli (EIEC) NOT DETECTED NOT DETECTED Final   Cryptosporidium NOT DETECTED NOT DETECTED Final   Cyclospora cayetanensis NOT DETECTED NOT DETECTED Final   Entamoeba histolytica NOT DETECTED NOT DETECTED Final   Giardia lamblia NOT DETECTED NOT DETECTED Final   Adenovirus F40/41 NOT DETECTED NOT DETECTED Final   Astrovirus NOT DETECTED NOT DETECTED Final   Norovirus GI/GII NOT DETECTED NOT DETECTED Final   Rotavirus A NOT DETECTED NOT DETECTED Final   Sapovirus (I, II, IV, and V) NOT DETECTED NOT DETECTED Final    Comment: Performed at [facility], [address] [place], [place] [address]  MRSA PCR Screening     Status: Abnormal   Collection Time: 01/26/20  2:35 PM   Specimen: Nasal Mucosa; Nasopharyngeal  Result Value Ref Range Status   MRSA by PCR POSITIVE (A) NEGATIVE Final    Comment:        The GeneXpert MRSA Assay (FDA approved for NASAL specimens only), is one component of a comprehensive MRSA colonization surveillance program. It is not intended to diagnose MRSA infection nor to guide or monitor treatment for MRSA infections. RESULT CALLED TO, READ BACK BY AND VERIFIED WITH: RN CHARGE NURSE 01/26/20 AT 1635 DW Performed at [facility], [address] [place], [place] [address]      Time coordinating discharge: Over 30 minutes  SIGNED:   Azucena Fallen, DO [facility] 01/30/2020, 1:29 PM Pager   If 7PM-7AM, please contact  night-coverage [email or web address]

## 2020-01-30 NOTE — Progress Notes (Signed)
Physical Therapy Treatment Patient Details Name: Katrina [name] MRN: [id number] DOB: Mar 05, 1990 Today's Date: 01/30/2020    History of Present Illness Pt is a 29yo female who presenting to hosptial with body aches, fever, SOB, and diarrhea. Found to COVID+. Also had chest pain, when for a R/L heart cath and coronoary angiography on 1/21 and transferred to from 5w to 2c.    PT Comments    Pt supine in bed on arrival.  Pt required assistance to perform stair training to prepare for return home.  Pt c/o nausea and pain in RLE.  Discussed purchasing a cane for home use and how to use it.  Pt continues to benefit from HHPT to improve strength and function.     Follow Up Recommendations  Home health PT;Supervision/Assistance - 24 hour     Equipment Recommendations  None recommended by PT    Recommendations for Other Services       Precautions / Restrictions Precautions Precautions: Fall Precaution Comments: watch BP Restrictions Weight Bearing Restrictions: No Other Position/Activity Restrictions: COVID +    Mobility  Bed Mobility Overal bed mobility: Modified Independent             General bed mobility comments: HOB elevated, increased time, pt moving LE slowly  Transfers Overall transfer level: Modified independent Equipment used: None Transfers: Sit to/from Stand           General transfer comment: Pt guarded but able to perform sit to stand unassisted.  Ambulation/Gait Ambulation/Gait assistance: Min guard Gait Distance (Feet): 80 Feet Assistive device: 1 person hand held assist Gait Pattern/deviations: Step-through pattern;Decreased stride length;Shuffle Gait velocity: slow   General Gait Details: Cues for increasing stride length this session.  Pt with shuffled short steps, as gt progressed stride length improved.  Noted to reach for rails and walls.   Stairs Stairs: Yes Stairs assistance: Min assist Stair Management: One rail Left Number of Stairs:  6 General stair comments: Cues for sequencing and hand placement.  Pt with painful right LE with edema.  Cues to lead with left when climbing stairs and descend with the R side.   Wheelchair Mobility    Modified Rankin (Stroke Patients Only)       Balance Overall balance assessment: Needs assistance Sitting-balance support: Feet supported;No upper extremity supported Sitting balance-Leahy Scale: Good       Standing balance-Leahy Scale: Poor                              Cognition Arousal/Alertness: Awake/alert Behavior During Therapy: WFL for tasks assessed/performed Overall Cognitive Status: Within Functional Limits for tasks assessed                                 General Comments: Pt remains self limiting due to pain but continues to show progress today      Exercises      General Comments        Pertinent Vitals/Pain Pain Assessment: 0-10 Pain Location: R LE and stomach Pain Descriptors / Indicators: Dull;Aching;Tingling Pain Intervention(s): Monitored during session;Repositioned    Home Living                      Prior Function            PT Goals (current goals can now be found in the care plan section) Acute Rehab PT  Goals Patient Stated Goal: home Potential to Achieve Goals: Good Progress towards PT goals: Progressing toward goals    Frequency    Min 3X/week      PT Plan Current plan remains appropriate    Co-evaluation              AM-PAC PT "6 Clicks" Mobility   Outcome Measure  Help needed turning from your back to your side while in a flat bed without using bedrails?: None Help needed moving from lying on your back to sitting on the side of a flat bed without using bedrails?: None Help needed moving to and from a bed to a chair (including a wheelchair)?: A Little Help needed standing up from a chair using your arms (e.g., wheelchair or bedside chair)?: A Little Help needed to walk in  hospital room?: A Little Help needed climbing 3-5 steps with a railing? : A Little 6 Click Score: 20    End of Session Equipment Utilized During Treatment: Gait belt Activity Tolerance: Patient tolerated treatment well Patient left: with call bell/phone within reach;in bed;with bed alarm set Nurse Communication: Mobility status PT Visit Diagnosis: Unsteadiness on feet (R26.81);Muscle weakness (generalized) (M62.81);Difficulty in walking, not elsewhere classified (R26.2)     Time: 0938-1829 PT Time Calculation (min) (ACUTE ONLY): 24 min  Charges:  $Gait Training: 8-22 mins $Therapeutic Activity: 8-22 mins                     Katrina [name] , PTA Acute Rehabilitation Services Pager [phone] Office [phone]     Katrina [name] 01/30/2020, 1:49 PM

## 2020-01-30 NOTE — TOC Transition Note (Addendum)
Transition of Care [facility]) - CM/SW Discharge Note   Patient Details  Name: Katrina [name] MRN: [id number] Date of Birth: 12-15-1990  Transition of Care [facility]) CM/SW Contact:  Lawerance Sabal, RN Phone Number: 01/30/2020, 2:06 PM   Clinical Narrative:    Could not arrange [facility] services, however made referral to [facility]. Patient aware. She requested cane. Ordered to be delivered to ASAP for DC. Requested MD to send meds through [facility] so she could have them sent home with her.  Working on getting transportation through [employer]. Waiver signed on chart.   [facility] unable to accept    Final next level of care: Home/Self Care Barriers to Discharge: No Barriers Identified   Patient Goals and CMS Choice        Discharge Placement                       Discharge Plan and Services                DME Arranged: Gilmer Mor DME Agency: [employer] Date DME Agency Contacted: 01/30/20 Time DME Agency Contacted: [phone] Representative spoke with at DME Agency: shiela            Social Determinants of Health (SDOH) Interventions     Readmission Risk Interventions No flowsheet data found.

## 2020-02-20 ENCOUNTER — Ambulatory Visit (INDEPENDENT_AMBULATORY_CARE_PROVIDER_SITE_OTHER): Payer: Medicaid Other

## 2020-02-20 ENCOUNTER — Ambulatory Visit (INDEPENDENT_AMBULATORY_CARE_PROVIDER_SITE_OTHER): Payer: Medicaid Other | Admitting: Internal Medicine

## 2020-02-20 ENCOUNTER — Other Ambulatory Visit: Payer: Self-pay

## 2020-02-20 ENCOUNTER — Encounter: Payer: Self-pay | Admitting: Radiology

## 2020-02-20 ENCOUNTER — Encounter: Payer: Self-pay | Admitting: Internal Medicine

## 2020-02-20 VITALS — BP 110/70 | HR 73 | Ht 67.0 in | Wt 144.0 lb

## 2020-02-20 DIAGNOSIS — R002 Palpitations: Secondary | ICD-10-CM | POA: Diagnosis not present

## 2020-02-20 DIAGNOSIS — I319 Disease of pericardium, unspecified: Secondary | ICD-10-CM

## 2020-02-20 DIAGNOSIS — U071 COVID-19: Secondary | ICD-10-CM | POA: Diagnosis not present

## 2020-02-20 NOTE — Progress Notes (Signed)
Cardiology Office Note:    Date:  02/20/2020   ID:  Jannet Calip, DOB January 07, 1990, MRN [id number]  PCP:  Arlyss Repress   [place]  Cardiologist:  No primary care provider on file.  Advanced Practice Provider:  No care team member to display Electrophysiologist:  None       CC: Follow up myopericarditis  History of Present Illness:    Katrina [name] is a 30 y.o. female with a hx of COVID-19 and subsequent myopericarditis seen 01/25/20 in the setting for hospitalization presenting for outpatient follow up 02/20/20.    Patient notes that she is doing OK.  Since last visit notes improvement in chest pain.  Relevant interval testing or therapy include getting a steroid taper:  (fast taper- 12 days starting 01/30/20).    Notes chest pain that persists with sitting up; or laying on her left side.  Feels work on her back or her left sideNo SOB/DOE and no PND/Orthopnea.  No weight gain or leg swelling.  Patient is noticing spontaneous funny heart beats post COVID-19.  Also notes that she also feels hard heart beat.  Doing well with her kids:  Slowing increase her activity.  Past Medical History:  Diagnosis Date  . Chronic back pain   . Chronic lower back pain   . Ectopic pregnancy 2017  . Migraine     Past Surgical History:  Procedure Laterality Date  . ECTOPIC PREGNANCY SURGERY  2017  . RIGHT/LEFT HEART CATH AND CORONARY ANGIOGRAPHY N/A 01/25/2020   Procedure: RIGHT/LEFT HEART CATH AND CORONARY ANGIOGRAPHY;  Surgeon: Burnell Blanks, MD;  Location: [facility];  Service: Cardiovascular;  Laterality: N/A;    Current Medications: Current Meds  Medication Sig  . ibuprofen (ADVIL) 600 MG tablet Take 1 tablet (600 mg total) by mouth 3 (three) times daily with meals.  Marland Kitchen MITIGARE 0.6 MG CAPS Take 1 capsule by mouth daily.  . ondansetron (ZOFRAN) 4 MG tablet Take 1 tablet (4 mg total) by mouth every 6 (six) hours as needed for nausea.      Allergies:   Aspirin and Sulfamethoxazole-trimethoprim   Social History   Socioeconomic History  . Marital status: Single    Spouse name: Not on file  . Number of children: Not on file  . Years of education: Not on file  . Highest education level: Not on file  Occupational History  . Not on file  Tobacco Use  . Smoking status: Light Tobacco Smoker  . Smokeless tobacco: Never Used  . Tobacco comment: Very rare smoking  Substance and Sexual Activity  . Alcohol use: Not Currently  . Drug use: Never  . Sexual activity: Not on file  Other Topics Concern  . Not on file  Social History Narrative  . Not on file   Social Determinants of Health   Financial Resource Strain: Not on file  Food Insecurity: Not on file  Transportation Needs: Not on file  Physical Activity: Not on file  Stress: Not on file  Social Connections: Not on file     Family History: The patient's family history includes Arrhythmia in her mother; Heart attack in her father; Heart disease in her maternal grandfather; Heart failure (age of onset: 73) in her brother.  ROS:   Please see the history of present illness.    All other systems reviewed and are negative.  EKGs/Labs/Other Studies Reviewed:    The following studies were reviewed today:  EKG:  03/01/20: SR rate 73 no PR depressions, baseline artifact 01/25/20: Sinus Tachycardia rate 106 with global ST elevations, no PR depression  Transthoracic Echocardiogram: Date: 01/25/20 Results: IMPRESSIONS  1. Posterolateral hypokinesis. Hypokinesis of the basal to mid regions  with apical sparing. 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  Left ventricular ejection fraction, by estimation, is 40 to 45%. The left  ventricle has mildly decreased  function. The left ventricle demonstrates regional wall motion  abnormalities (see scoring diagram/findings for description). Left  ventricular diastolic parameters are consistent with Grade II  diastolic  dysfunction (pseudonormalization).  2. Hypokinesis of the apical RV. Right ventricular systolic function is  mildly reduced. There is normal pulmonary artery systolic pressure.  3. A small pericardial effusion is present.  4. Mild mitral valve regurgitation.  5. The aortic valve is tricuspid.  6. The inferior vena cava is normal in size with <50% respiratory  variability, suggesting right atrial pressure of 8 mmHg.   Left/Right Heart Catheterizations: Date: 01/25/20 Results: 1. No angiographic evidence of CAD 2. Normal filling pressures 3. Elevated troponin likely due demand ischemia from viral syndrome/hypotension/dehydration.   Recommendations: No further ischemic evaluation.   Recent Labs: 01/30/2020: ALT 43; BUN 23; Creatinine, Ser 0.66; Hemoglobin 11.2; Magnesium 2.1; Platelets 268; Potassium 5.0; Sodium 135  Recent Lipid Panel    Component Value Date/Time   TRIG 263 (H) 01/25/2020 0354    Risk Assessment/Calculations:     N/A  Physical Exam:    VS:  BP 110/70   Pulse 73   Ht 5' 7"  (1.702 m)   Wt 144 lb (65.3 kg)   SpO2 99%   BMI 22.55 kg/m     Wt Readings from Last 3 Encounters:  02/20/20 144 lb (65.3 kg)  01/24/20 144 lb (65.3 kg)     GEN:  Well nourished, well developed in no acute distress HEENT: Normal NECK: No JVD; No carotid bruits LYMPHATICS: No lymphadenopathy CARDIAC: RRR, no murmurs, NO rubs, no gallops RESPIRATORY:  Clear to auscultation without rales, wheezing or rhonchi  ABDOMEN: Soft, non-tender, non-distended MUSCULOSKELETAL:  No edema; No deformity  SKIN: Warm and dry NEUROLOGIC:  Alert and oriented x 3 PSYCHIATRIC:  Normal affect   ASSESSMENT:    1. Palpitations   2. Myopericarditis    PLAN:    In order of problems listed above:  Myopericarditis History of COVID-19 Subacute Pericarditis/Myopericarditis - ESR, CRP today - Symptomatic but improved - With ECG normalization - Echo notable for small pericardial  effusion - etiology from COVID-19 - Continue Ibuprofen 600 mg TID for four weeks (will consider drop if ESR and CRP are normal and CP resolves) - Started on colchicine 0.6 mg daily with weight of 65 kg for ~ 3 months - if elevation in biomarker will add new propranolol 44m XL - No anakinra at this time - Patient advised to limit physical activity to walking and light weight training (< 5kg) for at least 3 months and competitive sports for at least 6 months; patient previously did workout in the gym - will get heart monitor due to subsequent palpitations. - at next visit will repeat cardiac imaging  Three months  follow up (can overbook) unless new symptoms or abnormal test results warranting change in plan  Would be reasonable for  APP Follow up  Time Spent Directly with Patient:   I have spent a total of 40 minutes with the patient reviewing hospital notes, telemetry, EKGs, labs and examining the patient as well as establishing  an assessment and plan that was discussed personally with the patient.  > 50% of time was spent in direct patient care discussing prognosis of pericarditis.   Medication Adjustments/Labs and Tests Ordered: Current medicines are reviewed at length with the patient today.  Concerns regarding medicines are outlined above.  Orders Placed This Encounter  Procedures  . Sedimentation rate  . C-reactive protein  . LONG TERM MONITOR (3-14 DAYS)   No orders of the defined types were placed in this encounter.   Patient Instructions  Medication Instructions:  Your physician recommends that you continue on your current medications as directed. Please refer to the Current Medication list given to you today.  *If you need a refill on your cardiac medications before your next appointment, please call your pharmacy*   Lab Work: TODAY: ESR, CRP If you have labs (blood work) drawn today and your tests are completely normal, you will receive your results only  by: Marland Kitchen MyChart Message (if you have MyChart) OR . A paper copy in the mail If you have any lab test that is abnormal or we need to change your treatment, we will call you to review the results.   Testing/Procedures: Your provider has requested that you wear a 14 day heart monitor.    Follow-Up: At [facility], you and your health needs are our priority.  As part of our continuing mission to provide you with exceptional heart care, we have created designated Provider Care Teams.  These Care Teams include your primary Cardiologist (physician) and Advanced Practice Providers (APPs -  Physician Assistants and Nurse Practitioners) who all work together to provide you with the care you need, when you need it.     Your next appointment:   3 month(s)  The format for your next appointment:   In Person  Provider:   You may see Gasper Sells, MD        Other Instructions  ZIO XT- Long Term Monitor Instructions   Your physician has requested you wear your ZIO patch monitor___14__days.   This is a single patch monitor.  [employer] supplies one patch monitor per enrollment.  Additional stickers are not available.   Please do not apply patch if you will be having a Nuclear Stress Test, Echocardiogram, Cardiac CT, MRI, or Chest Xray during the time frame you would be wearing the monitor. The patch cannot be worn during these tests.  You cannot remove and re-apply the ZIO XT patch monitor.   Your ZIO patch monitor will be sent USPS Priority mail from [facility] directly to your home address. The monitor may also be mailed to a PO BOX if home delivery is not available.   It may take 3-5 days to receive your monitor after you have been enrolled.   Once you have received you monitor, please review enclosed instructions.  Your monitor has already been registered assigning a specific monitor serial # to you.   Applying the monitor   Shave hair from upper left chest.   Hold abrader  disc by orange tab.  Rub abrader in 40 strokes over left upper chest as indicated in your monitor instructions.   Clean area with 4 enclosed alcohol pads .  Use all pads to assure are is cleaned thoroughly.  Let dry.   Apply patch as indicated in monitor instructions.  Patch will be place under collarbone on left side of chest with arrow pointing upward.   Rub patch adhesive wings for 2 minutes.Remove white label marked "  1".  Remove white label marked "2".  Rub patch adhesive wings for 2 additional minutes.   While looking in a mirror, press and release button in center of patch.  A small green light will flash 3-4 times .  This will be your only indicator the monitor has been turned on.     Do not shower for the first 24 hours.  You may shower after the first 24 hours.   Press button if you feel a symptom. You will hear a small click.  Record Date, Time and Symptom in the Patient Log Book.   When you are ready to remove patch, follow instructions on last 2 pages of Patient Log Book.  Stick patch monitor onto last page of Patient Log Book.   Place Patient Log Book in [place] box.  Use locking tab on box and tape box closed securely.  The [employer] and [employer] has [employer] on it.  Please place in mailbox as soon as possible.  Your physician should have your test results approximately 7 days after the monitor has been mailed back to [facility].   Call [place] at [phone] if you have questions regarding your ZIO XT patch monitor.  Call them immediately if you see an orange light blinking on your monitor.   If your monitor falls off in less than 4 days contact our Monitor department at [phone].  If your monitor becomes loose or falls off after 4 days call [employer] at [phone] for suggestions on securing your monitor.       Signed, Werner Lean, MD  02/20/2020 11:35 AM    [place]

## 2020-02-20 NOTE — Progress Notes (Signed)
Enrolled patient for a 14 day Zio XT Monitor to be mailed to patients home  

## 2020-02-20 NOTE — Patient Instructions (Signed)
Medication Instructions:  Your physician recommends that you continue on your current medications as directed. Please refer to the Current Medication list given to you today.  *If you need a refill on your cardiac medications before your next appointment, please call your pharmacy*   Lab Work: TODAY: ESR, CRP If you have labs (blood work) drawn today and your tests are completely normal, you will receive your results only by: Marland Kitchen MyChart Message (if you have MyChart) OR . A paper copy in the mail If you have any lab test that is abnormal or we need to change your treatment, we will call you to review the results.   Testing/Procedures: Your provider has requested that you wear a 14 day heart monitor.    Follow-Up: At [facility], you and your health needs are our priority.  As part of our continuing mission to provide you with exceptional heart care, we have created designated Provider Care Teams.  These Care Teams include your primary Cardiologist (physician) and Advanced Practice Providers (APPs -  Physician Assistants and Nurse Practitioners) who all work together to provide you with the care you need, when you need it.     Your next appointment:   3 month(s)  The format for your next appointment:   In Person  Provider:   You may see Gasper Sells, MD        Other Instructions  ZIO XT- Long Term Monitor Instructions   Your physician has requested you wear your ZIO patch monitor___14__days.   This is a single patch monitor.  [employer] supplies one patch monitor per enrollment.  Additional stickers are not available.   Please do not apply patch if you will be having a Nuclear Stress Test, Echocardiogram, Cardiac CT, MRI, or Chest Xray during the time frame you would be wearing the monitor. The patch cannot be worn during these tests.  You cannot remove and re-apply the ZIO XT patch monitor.   Your ZIO patch monitor will be sent USPS Priority mail from [facility]  directly to your home address. The monitor may also be mailed to a PO BOX if home delivery is not available.   It may take 3-5 days to receive your monitor after you have been enrolled.   Once you have received you monitor, please review enclosed instructions.  Your monitor has already been registered assigning a specific monitor serial # to you.   Applying the monitor   Shave hair from upper left chest.   Hold abrader disc by orange tab.  Rub abrader in 40 strokes over left upper chest as indicated in your monitor instructions.   Clean area with 4 enclosed alcohol pads .  Use all pads to assure are is cleaned thoroughly.  Let dry.   Apply patch as indicated in monitor instructions.  Patch will be place under collarbone on left side of chest with arrow pointing upward.   Rub patch adhesive wings for 2 minutes.Remove white label marked "1".  Remove white label marked "2".  Rub patch adhesive wings for 2 additional minutes.   While looking in a mirror, press and release button in center of patch.  A small green light will flash 3-4 times .  This will be your only indicator the monitor has been turned on.     Do not shower for the first 24 hours.  You may shower after the first 24 hours.   Press button if you feel a symptom. You will hear a small click.  Record Date, Time and Symptom in the Patient Log Book.   When you are ready to remove patch, follow instructions on last 2 pages of Patient Log Book.  Stick patch monitor onto last page of Patient Log Book.   Place Patient Log Book in [place] box.  Use locking tab on box and tape box closed securely.  The [employer] and [employer] has [employer] on it.  Please place in mailbox as soon as possible.  Your physician should have your test results approximately 7 days after the monitor has been mailed back to [facility].   Call [place] at [phone] if you have questions regarding your ZIO XT patch monitor.  Call them  immediately if you see an orange light blinking on your monitor.   If your monitor falls off in less than 4 days contact our Monitor department at [phone].  If your monitor becomes loose or falls off after 4 days call [employer] at [phone] for suggestions on securing your monitor.

## 2020-02-21 LAB — SEDIMENTATION RATE: Sed Rate: 19 mm/hr (ref 0–32)

## 2020-02-21 LAB — C-REACTIVE PROTEIN: CRP: <1 mg/L (ref 0–10)

## 2020-02-21 NOTE — Addendum Note (Signed)
Addended by: Oleta Mouse on: 02/21/2020 03:42 PM   Modules accepted: Orders

## 2020-02-26 DIAGNOSIS — R002 Palpitations: Secondary | ICD-10-CM

## 2020-03-06 MED FILL — MITIGARE 0.6 MG CAPS: 0.6 | 30 days supply | Qty: 30 | Fill #0

## 2020-06-03 NOTE — Progress Notes (Deleted)
Cardiology Office Note:    Date:  06/03/2020   ID:  Katrina [name], DOB 1990-11-26, MRN [id number]  PCP:  Arlyss Repress   [place]  Cardiologist:  None  Advanced Practice Provider:  No care team member to display Electrophysiologist:  None      CC: Follow up myopericarditis  History of Present Illness:    Katrina [name] is a 30 y.o. female with a hx of COVID-19 and subsequent myopericarditis seen 01/25/20 in the setting for hospitalization presenting for outpatient follow up 02/20/20.  In interim of this visit, patient had normal ESR and CRP.   Seen 06/04/20..  Patient notes that (s)he is doing ***.  Since day prior/last visit notes *** changes.  Relevant interval testing or therapy include ***.  There are no*** interval hospital/ED visit.    No chest pain or pressure ***.  No SOB/DOE*** and no PND/Orthopnea***.  No weight gain or leg swelling***.  No palpitations or syncope ***.  Ambulatory blood pressure ***.   Past Medical History:  Diagnosis Date  . Chronic back pain   . Chronic lower back pain   . Ectopic pregnancy 2017  . Migraine     Past Surgical History:  Procedure Laterality Date  . ECTOPIC PREGNANCY SURGERY  2017  . RIGHT/LEFT HEART CATH AND CORONARY ANGIOGRAPHY N/A 01/25/2020   Procedure: RIGHT/LEFT HEART CATH AND CORONARY ANGIOGRAPHY;  Surgeon: Burnell Blanks, MD;  Location: [place] [facility];  Service: Cardiovascular;  Laterality: N/A;    Current Medications: No outpatient medications have been marked as taking for the 06/04/20 encounter (Appointment) with Werner Lean, MD.     Allergies:   Aspirin and Sulfamethoxazole-trimethoprim   Social History   Socioeconomic History  . Marital status: Single    Spouse name: Not on file  . Number of children: Not on file  . Years of education: Not on file  . Highest education level: Not on file  Occupational History  . Not on file  Tobacco Use  . Smoking status:  Light Tobacco Smoker  . Smokeless tobacco: Never Used  . Tobacco comment: Very rare smoking  Substance and Sexual Activity  . Alcohol use: Not Currently  . Drug use: Never  . Sexual activity: Not on file  Other Topics Concern  . Not on file  Social History Narrative  . Not on file   Social Determinants of Health   Financial Resource Strain: Not on file  Food Insecurity: Not on file  Transportation Needs: Not on file  Physical Activity: Not on file  Stress: Not on file  Social Connections: Not on file     Family History: The patient's family history includes Arrhythmia in her mother; Heart attack in her father; Heart disease in her maternal grandfather; Heart failure (age of onset: 60) in her brother.  ROS:   Please see the history of present illness.    All other systems reviewed and are negative.  EKGs/Labs/Other Studies Reviewed:    The following studies were reviewed today:  EKG:  03/01/20: SR rate 73 no PR depressions, baseline artifact 01/25/20: Sinus Tachycardia rate 106 with global ST elevations, no PR depression  Transthoracic Echocardiogram: Date: 01/25/20 Results: IMPRESSIONS  1. Posterolateral hypokinesis. Hypokinesis of the basal to mid regions  with apical sparing. 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  Left ventricular ejection fraction, by estimation, is 40 to 45%. The left  ventricle has mildly decreased  function. The left ventricle demonstrates  regional wall motion  abnormalities (see scoring diagram/findings for description). Left  ventricular diastolic parameters are consistent with Grade II diastolic  dysfunction (pseudonormalization).  2. Hypokinesis of the apical RV. Right ventricular systolic function is  mildly reduced. There is normal pulmonary artery systolic pressure.  3. A small pericardial effusion is present.  4. Mild mitral valve regurgitation.  5. The aortic valve is tricuspid.  6. The inferior vena cava is  normal in size with <50% respiratory  variability, suggesting right atrial pressure of 8 mmHg.   Left/Right Heart Catheterizations: Date: 01/25/20 Results: 1. No angiographic evidence of CAD 2. Normal filling pressures 3. Elevated troponin likely due demand ischemia from viral syndrome/hypotension/dehydration.   Recommendations: No further ischemic evaluation.   Recent Labs: 01/30/2020: ALT 43; BUN 23; Creatinine, Ser 0.66; Hemoglobin 11.2; Magnesium 2.1; Platelets 268; Potassium 5.0; Sodium 135  Recent Lipid Panel    Component Value Date/Time   TRIG 263 (H) 01/25/2020 0354    Risk Assessment/Calculations:     N/A  Physical Exam:    VS:  There were no vitals taken for this visit.    Wt Readings from Last 3 Encounters:  02/20/20 65.3 kg  01/24/20 65.3 kg     GEN:  Well nourished, well developed in no acute distress HEENT: Normal NECK: No JVD; No carotid bruits LYMPHATICS: No lymphadenopathy CARDIAC: RRR, no murmurs, NO rubs, no gallops RESPIRATORY:  Clear to auscultation without rales, wheezing or rhonchi  ABDOMEN: Soft, non-tender, non-distended MUSCULOSKELETAL:  No edema; No deformity  SKIN: Warm and dry NEUROLOGIC:  Alert and oriented x 3 PSYCHIATRIC:  Normal affect   ASSESSMENT:    No diagnosis found. PLAN:    In order of problems listed above:  Myopericarditis History of COVID-19 Subacute Pericarditis/Myopericarditis Elevated TG likely in the setting of medication - lipids today  - Symptomatic but improved *** - With ECG normalization - Echo notable for small pericardial effusion *** - etiology from COVID-19 - Continue Ibuprofen 600 mg TID for four weeks (will consider drop if ESR and CRP are normal and CP resolves) *** - Started on colchicine 0.6 mg daily with weight of 65 kg for ~ 3 months ***   - Patient advised to limit physical activity to walking and light weight training (< 5kg) for at least 3 months and competitive sports for at least 6  months; patient previously did workout in the gym - will get heart monitor due to subsequent palpitations. - at next visit will repeat cardiac imaging  *** follow up unless new symptoms or abnormal test results warranting change in plan  Would be reasonable for *** Video Visit Follow up  Would be reasonable for *** APP Follow up     Medication Adjustments/Labs and Tests Ordered: Current medicines are reviewed at length with the patient today.  Concerns regarding medicines are outlined above.  No orders of the defined types were placed in this encounter.  No orders of the defined types were placed in this encounter.   There are no Patient Instructions on file for this visit.   Signed, Werner Lean, MD  06/03/2020 11:53 AM    [place]

## 2020-06-04 ENCOUNTER — Ambulatory Visit: Payer: Medicaid Other | Admitting: Internal Medicine

## 2020-07-23 NOTE — Progress Notes (Signed)
Cardiology Office Note:    Date:  07/24/2020   ID:  Katrina [name], DOB 06-01-1990, MRN [id number]  PCP:  Leota Jacobsen   [facility]  Cardiologist:  Christell Constant, MD  Advanced Practice Provider:  No care team member to display Electrophysiologist:  None      CC: Follow up myopericarditis  History of Present Illness:    Katrina [name] is a 30 y.o. female with a hx of COVID-19 and subsequent myopericarditis seen 01/25/20 in the setting for hospitalization presenting for outpatient follow up 02/20/20.  Seen 07/24/20.  Patient notes that she is doing OK all things considered.   Relevant interval testing or therapy include only on PRN ibuprofen.  There are no interval hospital/ED visit.    No chest pain or pressure .  No SOB/DOE and no PND/Orthopnea.  No weight gain or leg swelling.  No palpitations or syncope.  Still have a weird pinch in her chest after laughing and deep breath.  Does note leg pain worse with walking and leg cramping walking up a hill.   Past Medical History:  Diagnosis Date   Chronic back pain    Chronic lower back pain    Ectopic pregnancy 2017   Migraine     Past Surgical History:  Procedure Laterality Date   ECTOPIC PREGNANCY SURGERY  2017   RIGHT/LEFT HEART CATH AND CORONARY ANGIOGRAPHY N/A 01/25/2020   Procedure: RIGHT/LEFT HEART CATH AND CORONARY ANGIOGRAPHY;  Surgeon: Kathleene Hazel, MD;  Location: [facility];  Service: Cardiovascular;  Laterality: N/A;    Current Medications: Current Meds  Medication Sig   ibuprofen (ADVIL) 600 MG tablet TAKE 1 TABLET (600 MG TOTAL) BY MOUTH THREE TIMES DAILY WITH MEALS.     Allergies:   Aspirin and Sulfamethoxazole-trimethoprim   Social History   Socioeconomic History   Marital status: Single    Spouse name: Not on file   Number of children: Not on file   Years of education: Not on file   Highest education level: Not on file  Occupational History   Not  on file  Tobacco Use   Smoking status: Former    Types: Cigarettes   Smokeless tobacco: Never   Tobacco comments:    Very rare smoking  Substance and Sexual Activity   Alcohol use: Not Currently   Drug use: Never   Sexual activity: Not on file  Other Topics Concern   Not on file  Social History Narrative   Not on file   Social Determinants of Health   Financial Resource Strain: Not on file  Food Insecurity: Not on file  Transportation Needs: Not on file  Physical Activity: Not on file  Stress: Not on file  Social Connections: Not on file    Social: Since last visit has left a prior relationship; has three young children  Family History: The patient's family history includes Arrhythmia in her mother; Heart attack in her father; Heart disease in her maternal grandfather; Heart failure (age of onset: 27) in her brother.  ROS:   Please see the history of present illness.    All other systems reviewed and are negative.  EKGs/Labs/Other Studies Reviewed:    The following studies were reviewed today:  EKG:  03/01/20: SR rate 73 no PR depressions, baseline artifact 01/25/20: Sinus Tachycardia rate 106 with global ST elevations, no PR depression  Cardiac Event Monitoring: Date: 03/20/20 Results: Patient had a minimum heart rate of 39 bpm,  maximum heart rate of 161 bpm, and average heart rate of 80 bpm. Predominant underlying rhythm was sinus rhythm. Isolated PACs were rare (<1.0%). Isolated PVCs were rare (<1.0%), with rare couplets present. Mobitz Type I Heart block was detected (nocturnal). Triggered and diary events associated with sinus bradycardia, sinus rhythm, and sinus tachycardia.   No malignant arrhythmias.   Transthoracic Echocardiogram: Date: 01/25/20 Results: IMPRESSIONS   1. Posterolateral hypokinesis. Hypokinesis of the basal to mid regions  with apical sparing. 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  Left ventricular ejection fraction, by  estimation, is 40 to 45%. The left  ventricle has mildly decreased  function. The left ventricle demonstrates regional wall motion  abnormalities (see scoring diagram/findings for description). Left  ventricular diastolic parameters are consistent with Grade II diastolic  dysfunction (pseudonormalization).   2. Hypokinesis of the apical RV. Right ventricular systolic function is  mildly reduced. There is normal pulmonary artery systolic pressure.   3. A small pericardial effusion is present.   4. Mild mitral valve regurgitation.   5. The aortic valve is tricuspid.   6. The inferior vena cava is normal in size with <50% respiratory  variability, suggesting right atrial pressure of 8 mmHg.   Left/Right Heart Catheterizations: Date: 01/25/20 Results: 1. No angiographic evidence of CAD 2. Normal filling pressures 3. Elevated troponin likely due demand ischemia from viral syndrome/hypotension/dehydration.    Recommendations: No further ischemic evaluation.   Recent Labs: 01/30/2020: ALT 43; BUN 23; Creatinine, Ser 0.66; Hemoglobin 11.2; Magnesium 2.1; Platelets 268; Potassium 5.0; Sodium 135  Recent Lipid Panel    Component Value Date/Time   TRIG 263 (H) 01/25/2020 0354    Risk Assessment/Calculations:     N/A  Physical Exam:    VS:  BP 100/68   Pulse 64   Ht 5\' 7"  (1.702 m)   Wt 68.5 kg   BMI 23.65 kg/m     Wt Readings from Last 3 Encounters:  07/24/20 68.5 kg  02/20/20 65.3 kg  01/24/20 65.3 kg     GEN:  Well nourished, well developed in no acute distress HEENT: Normal NECK: No JVD; No carotid bruits LYMPHATICS: No lymphadenopathy CARDIAC: RRR, no murmurs, NO rubs, no gallops; 2+ DP pulses bilateral legs RESPIRATORY:  Clear to auscultation without rales, wheezing or rhonchi  ABDOMEN: Soft, non-tender, non-distended MUSCULOSKELETAL:  No edema; No deformity  SKIN: Warm and dry NEUROLOGIC:  Alert and oriented x 3 PSYCHIATRIC:  Normal affect   ASSESSMENT:    1.  Myopericarditis   2. Claudication in peripheral vascular disease (HCC)   3. History of COVID-19   4. Right leg claudication (HCC)     PLAN:    In order of problems listed above:  History Myopericarditis History of COVID-19 Right leg leg claudication - down to prn ibuprofen (not for CP) - no exercise restrictions - no further palpitations - echo for f/u LV function - will get R ABI and Duplex to assist in leg claudication DDX  PRN follow up unless new symptoms or abnormal test results warranting change in plan  Would be reasonable for  Video Visit Follow up Would be reasonable for  APP Follow up    Medication Adjustments/Labs and Tests Ordered: Current medicines are reviewed at length with the patient today.  Concerns regarding medicines are outlined above.  Orders Placed This Encounter  Procedures   ECHOCARDIOGRAM COMPLETE   VAS 01/26/20 LOWER EXTREMITY ARTERIAL DUPLEX   VAS [place] ABI WITH/WO TBI    No  orders of the defined types were placed in this encounter.   Patient Instructions  Medication Instructions:  Your physician recommends that you continue on your current medications as directed. Please refer to the Current Medication list given to you today.  *If you need a refill on your cardiac medications before your next appointment, please call your pharmacy*   Lab Work: None ordered If you have labs (blood work) drawn today and your tests are completely normal, you will receive your results only by: MyChart Message (if you have MyChart) OR A paper copy in the mail If you have any lab test that is abnormal or we need to change your treatment, we will call you to review the results.   Testing/Procedures: Your physician has requested that you have an echocardiogram. Echocardiography is a painless test that uses sound waves to create images of your heart. It provides your doctor with information about the size and shape of your heart and how well your heart's chambers and  valves are working. This procedure takes approximately one hour. There are no restrictions for this procedure.  Your physician has requested that you have a lower extremity arterial exercise duplex. During this test, exercise and ultrasound are used to evaluate arterial blood flow in the legs. Allow one hour for this exam. There are no restrictions or special instructions.    Follow-Up: At [facility], you and your health needs are our priority.  As part of our continuing mission to provide you with exceptional heart care, we have created designated Provider Care Teams.  These Care Teams include your primary Cardiologist (physician) and Advanced Practice Providers (APPs -  Physician Assistants and Nurse Practitioners) who all work together to provide you with the care you need, when you need it.  We recommend signing up for the patient portal called "MyChart".  Sign up information is provided on this After Visit Summary.  MyChart is used to connect with patients for Virtual Visits (Telemedicine).  Patients are able to view lab/test results, encounter notes, upcoming appointments, etc.  Non-urgent messages can be sent to your provider as well.   To learn more about what you can do with MyChart, go to [email or web address].    Your next appointment:   AS NEEDED  Provider:   You may see Christell Constant, MD or one of the following Advanced Practice Providers on your designated Care Team:   Ronie Spies, PA-C Jacolyn Reedy, PA-C   Other Instructions    Signed, Christell Constant, MD  07/24/2020 10:26 AM    [facility]

## 2020-07-24 ENCOUNTER — Other Ambulatory Visit: Payer: Self-pay

## 2020-07-24 ENCOUNTER — Encounter: Payer: Self-pay | Admitting: Internal Medicine

## 2020-07-24 ENCOUNTER — Ambulatory Visit: Payer: Medicaid Other | Admitting: Internal Medicine

## 2020-07-24 VITALS — BP 100/68 | HR 64 | Ht 67.0 in | Wt 151.0 lb

## 2020-07-24 DIAGNOSIS — I739 Peripheral vascular disease, unspecified: Secondary | ICD-10-CM | POA: Insufficient documentation

## 2020-07-24 DIAGNOSIS — Z8616 Personal history of COVID-19: Secondary | ICD-10-CM

## 2020-07-24 DIAGNOSIS — I319 Disease of pericardium, unspecified: Secondary | ICD-10-CM | POA: Diagnosis not present

## 2020-07-24 NOTE — Patient Instructions (Signed)
Medication Instructions:  Your physician recommends that you continue on your current medications as directed. Please refer to the Current Medication list given to you today.  *If you need a refill on your cardiac medications before your next appointment, please call your pharmacy*   Lab Work: None ordered If you have labs (blood work) drawn today and your tests are completely normal, you will receive your results only by: MyChart Message (if you have MyChart) OR A paper copy in the mail If you have any lab test that is abnormal or we need to change your treatment, we will call you to review the results.   Testing/Procedures: Your physician has requested that you have an echocardiogram. Echocardiography is a painless test that uses sound waves to create images of your heart. It provides your doctor with information about the size and shape of your heart and how well your heart's chambers and valves are working. This procedure takes approximately one hour. There are no restrictions for this procedure.  Your physician has requested that you have a lower extremity arterial exercise duplex. During this test, exercise and ultrasound are used to evaluate arterial blood flow in the legs. Allow one hour for this exam. There are no restrictions or special instructions.    Follow-Up: At [facility], you and your health needs are our priority.  As part of our continuing mission to provide you with exceptional heart care, we have created designated Provider Care Teams.  These Care Teams include your primary Cardiologist (physician) and Advanced Practice Providers (APPs -  Physician Assistants and Nurse Practitioners) who all work together to provide you with the care you need, when you need it.  We recommend signing up for the patient portal called "MyChart".  Sign up information is provided on this After Visit Summary.  MyChart is used to connect with patients for Virtual Visits (Telemedicine).   Patients are able to view lab/test results, encounter notes, upcoming appointments, etc.  Non-urgent messages can be sent to your provider as well.   To learn more about what you can do with MyChart, go to [email or web address].    Your next appointment:   AS NEEDED  Provider:   You may see Christell Constant, MD or one of the following Advanced Practice Providers on your designated Care Team:   Ronie Spies, PA-C Jacolyn Reedy, PA-C   Other Instructions

## 2020-08-11 ENCOUNTER — Other Ambulatory Visit: Payer: Self-pay

## 2020-08-11 ENCOUNTER — Ambulatory Visit (HOSPITAL_COMMUNITY)
Admission: RE | Admit: 2020-08-11 | Discharge: 2020-08-11 | Disposition: A | Discharge status: Home / Self Care | Payer: Medicaid Other | Source: Ambulatory Visit | Attending: Cardiovascular Disease | Admitting: Cardiovascular Disease

## 2020-08-11 DIAGNOSIS — I739 Peripheral vascular disease, unspecified: Secondary | ICD-10-CM | POA: Diagnosis present

## 2020-08-14 ENCOUNTER — Other Ambulatory Visit: Payer: Self-pay

## 2020-08-14 ENCOUNTER — Ambulatory Visit (HOSPITAL_COMMUNITY): Payer: Medicaid Other | Attending: Cardiology

## 2020-08-14 ENCOUNTER — Ambulatory Visit (HOSPITAL_COMMUNITY): Payer: Medicaid Other | Attending: Internal Medicine

## 2020-08-14 DIAGNOSIS — I319 Disease of pericardium, unspecified: Secondary | ICD-10-CM | POA: Insufficient documentation

## 2020-08-14 LAB — ECHOCARDIOGRAM COMPLETE
Area-P 1/2: 2.59 cm2
S' Lateral: 3.1 cm

## 2022-12-20 IMAGING — DX DG CHEST 1V PORT
1 series · 1 of 1 positions shown · non-contrast
Comparison: None.

CLINICAL DATA: Shortness of breath

EXAM:
PORTABLE CHEST 1 VIEW

[chest ap]
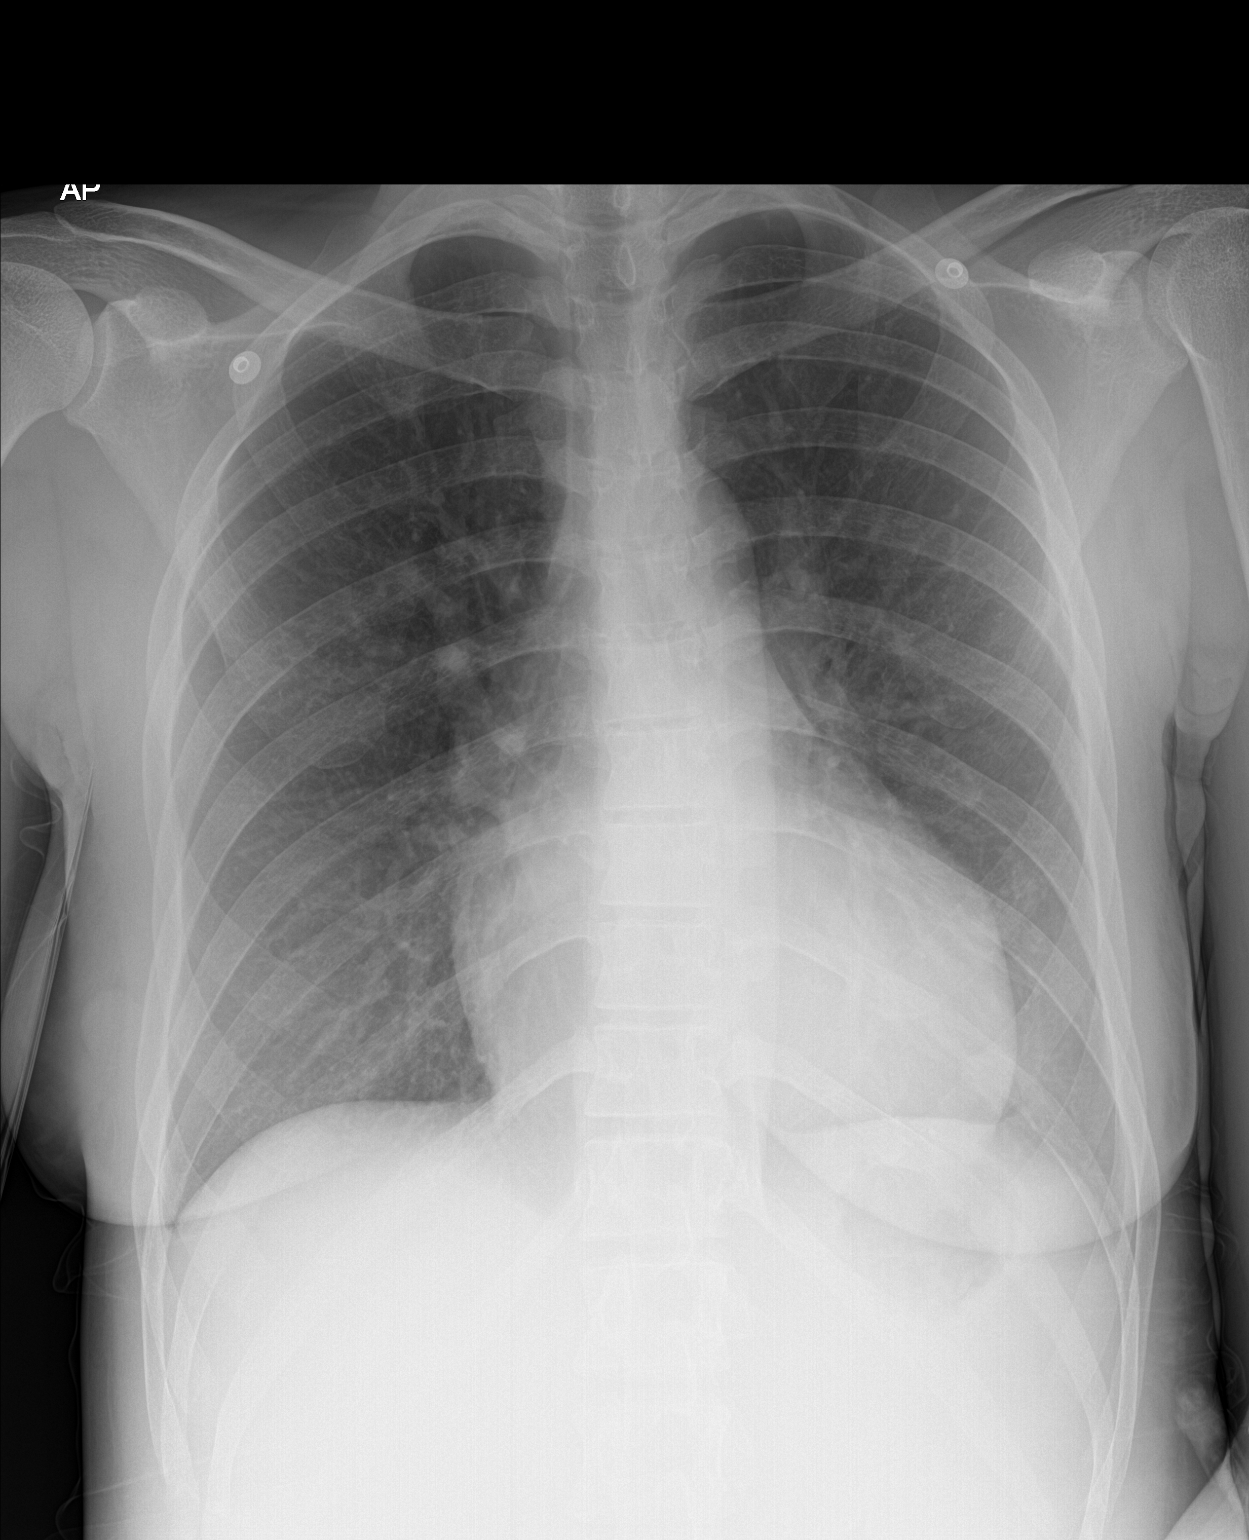

[1 of 1 positions shown; findings below may reference images not displayed]

FINDINGS: The heart size and mediastinal contours are within normal limits.
Both lungs are clear. The visualized skeletal structures are
unremarkable.
IMPRESSION: No active disease.
